# Patient Record
Sex: Female | Born: 1960
Health system: Southern US, Community
[De-identification: ages and names within clinical notes are randomized; demographics above are authoritative.]

## PROBLEM LIST (undated history)

## (undated) DIAGNOSIS — I471 Supraventricular tachycardia, unspecified: Secondary | ICD-10-CM

## (undated) DIAGNOSIS — R7303 Prediabetes: Secondary | ICD-10-CM

## (undated) DIAGNOSIS — E119 Type 2 diabetes mellitus without complications: Secondary | ICD-10-CM

## (undated) DIAGNOSIS — N83292 Other ovarian cyst, left side: Secondary | ICD-10-CM

## (undated) DIAGNOSIS — C439 Malignant melanoma of skin, unspecified: Secondary | ICD-10-CM

## (undated) HISTORY — DX: Supraventricular tachycardia: I47.1

## (undated) HISTORY — DX: Prediabetes: R73.03

## (undated) HISTORY — PX: CHOLECYSTECTOMY: SHX55

## (undated) HISTORY — DX: Type 2 diabetes mellitus without complications: E11.9

## (undated) HISTORY — DX: Supraventricular tachycardia, unspecified: I47.10

## (undated) HISTORY — DX: Other ovarian cyst, left side: N83.292

## (undated) HISTORY — DX: Malignant melanoma of skin, unspecified: C43.9

---

## 2001-01-07 ENCOUNTER — Ambulatory Visit (HOSPITAL_COMMUNITY): Admission: RE | Admit: 2001-01-07 | Discharge: 2001-01-07 | Payer: Self-pay | Admitting: Specialist

## 2001-01-07 ENCOUNTER — Encounter: Payer: Self-pay | Admitting: Specialist

## 2001-01-17 ENCOUNTER — Ambulatory Visit (HOSPITAL_COMMUNITY): Admission: RE | Admit: 2001-01-17 | Discharge: 2001-01-17 | Payer: Self-pay | Admitting: Specialist

## 2001-01-17 ENCOUNTER — Encounter: Payer: Self-pay | Admitting: Specialist

## 2001-09-09 ENCOUNTER — Other Ambulatory Visit: Admission: RE | Admit: 2001-09-09 | Discharge: 2001-09-09 | Payer: Self-pay | Admitting: Obstetrics and Gynecology

## 2002-01-16 ENCOUNTER — Ambulatory Visit (HOSPITAL_COMMUNITY): Admission: RE | Admit: 2002-01-16 | Discharge: 2002-01-16 | Payer: Self-pay | Admitting: *Deleted

## 2002-01-16 ENCOUNTER — Encounter: Payer: Self-pay | Admitting: Specialist

## 2002-02-17 ENCOUNTER — Encounter: Payer: Self-pay | Admitting: Family Medicine

## 2002-02-17 ENCOUNTER — Ambulatory Visit (HOSPITAL_COMMUNITY): Admission: RE | Admit: 2002-02-17 | Discharge: 2002-02-17 | Payer: Self-pay | Admitting: Family Medicine

## 2003-02-13 ENCOUNTER — Ambulatory Visit (HOSPITAL_COMMUNITY): Admission: RE | Admit: 2003-02-13 | Discharge: 2003-02-13 | Payer: Self-pay | Admitting: Specialist

## 2003-02-13 ENCOUNTER — Encounter: Payer: Self-pay | Admitting: Specialist

## 2004-03-11 ENCOUNTER — Ambulatory Visit (HOSPITAL_COMMUNITY): Admission: RE | Admit: 2004-03-11 | Discharge: 2004-03-11 | Payer: Self-pay | Admitting: Specialist

## 2004-05-12 ENCOUNTER — Ambulatory Visit (HOSPITAL_COMMUNITY): Admission: RE | Admit: 2004-05-12 | Discharge: 2004-05-12 | Payer: Self-pay | Admitting: Family Medicine

## 2005-04-10 ENCOUNTER — Emergency Department (HOSPITAL_COMMUNITY): Admission: EM | Admit: 2005-04-10 | Discharge: 2005-04-10 | Payer: Self-pay | Admitting: Emergency Medicine

## 2005-05-05 ENCOUNTER — Ambulatory Visit (HOSPITAL_COMMUNITY): Admission: RE | Admit: 2005-05-05 | Discharge: 2005-05-05 | Payer: Self-pay | Admitting: Specialist

## 2006-05-14 ENCOUNTER — Ambulatory Visit (HOSPITAL_COMMUNITY): Admission: RE | Admit: 2006-05-14 | Discharge: 2006-05-14 | Payer: Self-pay | Admitting: Internal Medicine

## 2007-07-08 ENCOUNTER — Ambulatory Visit (HOSPITAL_COMMUNITY): Admission: RE | Admit: 2007-07-08 | Discharge: 2007-07-08 | Payer: Self-pay | Admitting: Internal Medicine

## 2008-06-13 ENCOUNTER — Other Ambulatory Visit: Admission: RE | Admit: 2008-06-13 | Discharge: 2008-06-13 | Payer: Self-pay | Admitting: Obstetrics and Gynecology

## 2008-07-03 ENCOUNTER — Ambulatory Visit (HOSPITAL_COMMUNITY): Admission: RE | Admit: 2008-07-03 | Discharge: 2008-07-03 | Payer: Self-pay | Admitting: Obstetrics and Gynecology

## 2008-08-24 ENCOUNTER — Ambulatory Visit (HOSPITAL_COMMUNITY): Admission: RE | Admit: 2008-08-24 | Discharge: 2008-08-24 | Payer: Self-pay | Admitting: Internal Medicine

## 2009-08-15 ENCOUNTER — Other Ambulatory Visit: Admission: RE | Admit: 2009-08-15 | Discharge: 2009-08-15 | Payer: Self-pay | Admitting: Obstetrics and Gynecology

## 2009-08-29 ENCOUNTER — Ambulatory Visit (HOSPITAL_COMMUNITY): Admission: RE | Admit: 2009-08-29 | Discharge: 2009-08-29 | Payer: Self-pay | Admitting: Internal Medicine

## 2009-09-02 ENCOUNTER — Ambulatory Visit (HOSPITAL_COMMUNITY): Admission: RE | Admit: 2009-09-02 | Discharge: 2009-09-02 | Payer: Self-pay | Admitting: Obstetrics & Gynecology

## 2009-11-28 ENCOUNTER — Ambulatory Visit (HOSPITAL_COMMUNITY): Admission: RE | Admit: 2009-11-28 | Discharge: 2009-11-28 | Payer: Self-pay | Admitting: Obstetrics & Gynecology

## 2010-07-06 ENCOUNTER — Encounter: Payer: Self-pay | Admitting: Obstetrics and Gynecology

## 2010-07-06 ENCOUNTER — Encounter: Payer: Self-pay | Admitting: Specialist

## 2010-07-06 ENCOUNTER — Encounter: Payer: Self-pay | Admitting: Internal Medicine

## 2010-09-09 ENCOUNTER — Other Ambulatory Visit: Payer: Self-pay | Admitting: Obstetrics & Gynecology

## 2010-09-09 DIAGNOSIS — Z139 Encounter for screening, unspecified: Secondary | ICD-10-CM

## 2010-09-11 ENCOUNTER — Ambulatory Visit (HOSPITAL_COMMUNITY)
Admission: RE | Admit: 2010-09-11 | Discharge: 2010-09-11 | Disposition: A | Discharge status: Home / Self Care | Payer: 59 | Source: Ambulatory Visit | Attending: Obstetrics & Gynecology | Admitting: Obstetrics & Gynecology

## 2010-09-11 DIAGNOSIS — Z139 Encounter for screening, unspecified: Secondary | ICD-10-CM

## 2010-09-11 DIAGNOSIS — Z1231 Encounter for screening mammogram for malignant neoplasm of breast: Secondary | ICD-10-CM | POA: Insufficient documentation

## 2010-11-11 ENCOUNTER — Other Ambulatory Visit (HOSPITAL_COMMUNITY)
Admission: RE | Admit: 2010-11-11 | Discharge: 2010-11-11 | Disposition: A | Discharge status: Home / Self Care | Payer: 59 | Source: Ambulatory Visit | Attending: Obstetrics & Gynecology | Admitting: Obstetrics & Gynecology

## 2010-11-11 ENCOUNTER — Other Ambulatory Visit: Payer: Self-pay | Admitting: Obstetrics & Gynecology

## 2010-11-11 DIAGNOSIS — Z01419 Encounter for gynecological examination (general) (routine) without abnormal findings: Secondary | ICD-10-CM | POA: Insufficient documentation

## 2011-09-22 ENCOUNTER — Other Ambulatory Visit: Payer: Self-pay | Admitting: Obstetrics & Gynecology

## 2011-09-22 DIAGNOSIS — Z139 Encounter for screening, unspecified: Secondary | ICD-10-CM

## 2011-09-29 ENCOUNTER — Ambulatory Visit (HOSPITAL_COMMUNITY)
Admission: RE | Admit: 2011-09-29 | Discharge: 2011-09-29 | Disposition: A | Discharge status: Home / Self Care | Payer: 59 | Source: Ambulatory Visit | Attending: Obstetrics & Gynecology | Admitting: Obstetrics & Gynecology

## 2011-09-29 DIAGNOSIS — Z139 Encounter for screening, unspecified: Secondary | ICD-10-CM

## 2011-09-29 DIAGNOSIS — Z1231 Encounter for screening mammogram for malignant neoplasm of breast: Secondary | ICD-10-CM | POA: Insufficient documentation

## 2012-01-19 ENCOUNTER — Other Ambulatory Visit: Payer: Self-pay | Admitting: Obstetrics & Gynecology

## 2012-01-19 ENCOUNTER — Other Ambulatory Visit (HOSPITAL_COMMUNITY)
Admission: RE | Admit: 2012-01-19 | Discharge: 2012-01-19 | Disposition: A | Discharge status: Home / Self Care | Payer: 59 | Source: Ambulatory Visit | Attending: Obstetrics & Gynecology | Admitting: Obstetrics & Gynecology

## 2012-01-19 DIAGNOSIS — Z01419 Encounter for gynecological examination (general) (routine) without abnormal findings: Secondary | ICD-10-CM | POA: Insufficient documentation

## 2012-02-04 ENCOUNTER — Encounter (HOSPITAL_COMMUNITY): Payer: Self-pay | Admitting: Dietician

## 2012-02-04 NOTE — Progress Notes (Signed)
[facility] Diabetes Class Completion  Date:February 04, 2012  Time: 6:30 PM  Pt attended [facility]'s Diabetes Class on February 04, 2012.   Patient was educated on the following topics: carbohydrate metabolism in relation to diabetes, sources of carbohydrate, carbohydrate counting, meal planning strategies, food label reading, and portion control.   Melody Haver, RD, LDN Date:February 04, 2012 Time: 6:30 PM

## 2012-09-23 ENCOUNTER — Other Ambulatory Visit: Payer: Self-pay | Admitting: Obstetrics & Gynecology

## 2012-09-23 DIAGNOSIS — Z139 Encounter for screening, unspecified: Secondary | ICD-10-CM

## 2012-10-03 ENCOUNTER — Ambulatory Visit (HOSPITAL_COMMUNITY)
Admission: RE | Admit: 2012-10-03 | Discharge: 2012-10-03 | Disposition: A | Discharge status: Home / Self Care | Payer: No Typology Code available for payment source | Source: Ambulatory Visit | Attending: Obstetrics & Gynecology | Admitting: Obstetrics & Gynecology

## 2012-10-03 DIAGNOSIS — Z1231 Encounter for screening mammogram for malignant neoplasm of breast: Secondary | ICD-10-CM | POA: Insufficient documentation

## 2012-10-03 DIAGNOSIS — Z139 Encounter for screening, unspecified: Secondary | ICD-10-CM

## 2012-11-14 ENCOUNTER — Ambulatory Visit: Payer: Self-pay | Admitting: Obstetrics & Gynecology

## 2013-05-02 ENCOUNTER — Other Ambulatory Visit (HOSPITAL_COMMUNITY)
Admission: RE | Admit: 2013-05-02 | Discharge: 2013-05-02 | Disposition: A | Discharge status: Home / Self Care | Payer: No Typology Code available for payment source | Source: Ambulatory Visit | Attending: Obstetrics & Gynecology | Admitting: Obstetrics & Gynecology

## 2013-05-02 ENCOUNTER — Ambulatory Visit (INDEPENDENT_AMBULATORY_CARE_PROVIDER_SITE_OTHER): Payer: No Typology Code available for payment source | Admitting: Obstetrics & Gynecology

## 2013-05-02 ENCOUNTER — Encounter: Payer: Self-pay | Admitting: Obstetrics & Gynecology

## 2013-05-02 ENCOUNTER — Encounter (INDEPENDENT_AMBULATORY_CARE_PROVIDER_SITE_OTHER): Payer: Self-pay

## 2013-05-02 VITALS — BP 130/90 | Ht 70.0 in | Wt 214.0 lb

## 2013-05-02 DIAGNOSIS — Z1212 Encounter for screening for malignant neoplasm of rectum: Secondary | ICD-10-CM

## 2013-05-02 DIAGNOSIS — E119 Type 2 diabetes mellitus without complications: Secondary | ICD-10-CM | POA: Insufficient documentation

## 2013-05-02 DIAGNOSIS — Z01419 Encounter for gynecological examination (general) (routine) without abnormal findings: Secondary | ICD-10-CM | POA: Insufficient documentation

## 2013-05-02 DIAGNOSIS — N361 Urethral diverticulum: Secondary | ICD-10-CM | POA: Insufficient documentation

## 2013-05-02 DIAGNOSIS — Z1151 Encounter for screening for human papillomavirus (HPV): Secondary | ICD-10-CM | POA: Insufficient documentation

## 2013-05-02 MED ORDER — FLUTICASONE PROPIONATE 0.05 % EX CREA: TOPICAL_CREAM | Freq: Two times a day (BID) | CUTANEOUS | Status: DC

## 2013-05-02 NOTE — Addendum Note (Signed)
Addended by: Richardson Chiquito on: 05/02/2013 03:42 PM   Modules accepted: Orders

## 2013-05-02 NOTE — Progress Notes (Signed)
Patient ID: Julia Wang, female   DOB: 07/20/1960, 52 y.o.   MRN: [id number] Subjective:     Julia Wang is a 52 y.o. female here for a routine exam.  Patient's last menstrual period was 04/15/2013. No obstetric history on file. Current complaints: none.    Gynecologic History Patient's last menstrual period was 04/15/2013. Contraception: none Last Pap: 2013. Results were: normal Last mammogram: 2014. Results were: normal  Past Medical History  Diagnosis Date  . Diabetes mellitus without complication     History reviewed. No pertinent past surgical history.  OB History   Grav Para Term Preterm Abortions TAB SAB Ect Mult Living                  History   Social History  . Marital Status: Married    Spouse Name: N/A    Number of Children: N/A  . Years of Education: N/A   Social History Main Topics  . Smoking status: Never Smoker   . Smokeless tobacco: None  . Alcohol Use: None  . Drug Use: None  . Sexual Activity: None   Other Topics Concern  . None   Social History Narrative  . None    Family History  Problem Relation Age of Onset  . Cancer Mother      Review of Systems  Review of Systems  Constitutional: Negative for fever, chills, weight loss, malaise/fatigue and diaphoresis.  HENT: Negative for hearing loss, ear pain, nosebleeds, congestion, sore throat, neck pain, tinnitus and ear discharge.   Eyes: Negative for blurred vision, double vision, photophobia, pain, discharge and redness.  Respiratory: Negative for cough, hemoptysis, sputum production, shortness of breath, wheezing and stridor.   Cardiovascular: Negative for chest pain, palpitations, orthopnea, claudication, leg swelling and PND.  Gastrointestinal: negative for abdominal pain. Negative for heartburn, nausea, vomiting, diarrhea, constipation, blood in stool and melena.  Genitourinary: Negative for dysuria, urgency, frequency, hematuria and flank pain.  Musculoskeletal: Negative for  myalgias, back pain, joint pain and falls.  Skin: Negative for itching and rash.  Neurological: Negative for dizziness, tingling, tremors, sensory change, speech change, focal weakness, seizures, loss of consciousness, weakness and headaches.  Endo/Heme/Allergies: Negative for environmental allergies and polydipsia. Does not bruise/bleed easily.  Psychiatric/Behavioral: Negative for depression, suicidal ideas, hallucinations, memory loss and substance abuse. The patient is not nervous/anxious and does not have insomnia.        Objective:    Physical Exam  Vitals reviewed. Constitutional: She is oriented to person, place, and time. She appears well-developed and well-nourished.  HENT:  Head: Normocephalic and atraumatic.        Right Ear: External ear normal.  Left Ear: External ear normal.  Nose: Nose normal.  Mouth/Throat: Oropharynx is clear and moist.  Eyes: Conjunctivae and EOM are normal. Pupils are equal, round, and reactive to light. Right eye exhibits no discharge. Left eye exhibits no discharge. No scleral icterus.  Neck: Normal range of motion. Neck supple. No tracheal deviation present. No thyromegaly present.  Cardiovascular: Normal rate, regular rhythm, normal heart sounds and intact distal pulses.  Exam reveals no gallop and no friction rub.   No murmur heard. Respiratory: Effort normal and breath sounds normal. No respiratory distress. She has no wheezes. She has no rales. She exhibits no tenderness.  GI: Soft. Bowel sounds are normal. She exhibits no distension and no mass. There is no tenderness. There is no rebound and no guarding.  Genitourinary:  Breasts no masses skin changes  or nipple changes bilaterally      Vulva is normal without lesions Vagina is pink moist without discharge, midline urethral diverticulum stable, no complaint of urinary tract infections Cervix normal in appearance and pap is done Uterus is normal size shape and contour Adnexa is negative with  normal sized ovaries  Rectal    hemoccult negative, normal tone, no masses  Musculoskeletal: Normal range of motion. She exhibits no edema and no tenderness.  Neurological: She is alert and oriented to person, place, and time. She has normal reflexes. She displays normal reflexes. No cranial nerve deficit. She exhibits normal muscle tone. Coordination normal.  Skin: Skin is warm and dry. No rash noted. No erythema. No pallor.  Psychiatric: She has a normal mood and affect. Her behavior is normal. Judgment and thought content normal.       Assessment:    Healthy female exam.    Plan:    Follow up in: 1 year.

## 2013-09-14 ENCOUNTER — Other Ambulatory Visit (HOSPITAL_COMMUNITY): Payer: Self-pay | Admitting: Internal Medicine

## 2013-09-14 DIAGNOSIS — Z1231 Encounter for screening mammogram for malignant neoplasm of breast: Secondary | ICD-10-CM

## 2013-10-05 ENCOUNTER — Ambulatory Visit (HOSPITAL_COMMUNITY)
Admission: RE | Admit: 2013-10-05 | Discharge: 2013-10-05 | Disposition: A | Discharge status: Home / Self Care | Payer: No Typology Code available for payment source | Source: Ambulatory Visit | Attending: Internal Medicine | Admitting: Internal Medicine

## 2013-10-05 DIAGNOSIS — Z1231 Encounter for screening mammogram for malignant neoplasm of breast: Secondary | ICD-10-CM | POA: Insufficient documentation

## 2014-05-07 ENCOUNTER — Other Ambulatory Visit: Payer: No Typology Code available for payment source | Admitting: Obstetrics & Gynecology

## 2014-05-07 ENCOUNTER — Encounter: Payer: Self-pay | Admitting: Obstetrics & Gynecology

## 2014-05-07 ENCOUNTER — Other Ambulatory Visit (HOSPITAL_COMMUNITY)
Admission: RE | Admit: 2014-05-07 | Discharge: 2014-05-07 | Disposition: A | Discharge status: Home / Self Care | Payer: No Typology Code available for payment source | Source: Ambulatory Visit | Attending: Obstetrics & Gynecology | Admitting: Obstetrics & Gynecology

## 2014-05-07 ENCOUNTER — Ambulatory Visit (INDEPENDENT_AMBULATORY_CARE_PROVIDER_SITE_OTHER): Payer: No Typology Code available for payment source | Admitting: Obstetrics & Gynecology

## 2014-05-07 VITALS — BP 130/80 | Ht 69.2 in | Wt 219.0 lb

## 2014-05-07 DIAGNOSIS — Z01419 Encounter for gynecological examination (general) (routine) without abnormal findings: Secondary | ICD-10-CM | POA: Insufficient documentation

## 2014-05-07 DIAGNOSIS — Z1212 Encounter for screening for malignant neoplasm of rectum: Secondary | ICD-10-CM

## 2014-05-07 DIAGNOSIS — Z1211 Encounter for screening for malignant neoplasm of colon: Secondary | ICD-10-CM

## 2014-05-07 MED ORDER — SILVER SULFADIAZINE 1 % EX CREA: TOPICAL_CREAM | CUTANEOUS | Status: DC

## 2014-05-07 NOTE — Progress Notes (Signed)
Patient ID: Julia Wang, female   DOB: 1961-04-15, 53 y.o.   MRN: [id number] Subjective:     Julia Wang is a 53 y.o. female here for a routine exam.  Patient's last menstrual period was 04/14/2014. No obstetric history on file. Birth Control Method:  none Menstrual Calendar(currently): regular  Current complaints: none.   Current acute medical issues:  Type 2 DM   Recent Gynecologic History Patient's last menstrual period was 04/14/2014. Last Pap: 2014,  normal Last mammogram: 2015,  normal  Past Medical History  Diagnosis Date  . Diabetes mellitus without complication     History reviewed. No pertinent past surgical history.  OB History    No data available      History   Social History  . Marital Status: Married    Spouse Name: N/A    Number of Children: N/A  . Years of Education: N/A   Social History Main Topics  . Smoking status: Never Smoker   . Smokeless tobacco: None  . Alcohol Use: None  . Drug Use: None  . Sexual Activity: None   Other Topics Concern  . None   Social History Narrative    Family History  Problem Relation Age of Onset  . Cancer Mother     Current outpatient prescriptions: metFORMIN (GLUCOPHAGE) 500 MG tablet, Take by mouth 2 (two) times daily with a meal., Disp: , Rfl: ;  sertraline (ZOLOFT) 50 MG tablet, Take 50 mg by mouth daily., Disp: , Rfl: ;  fluticasone (CUTIVATE) 0.05 % cream, Apply topically 2 (two) times daily. (Patient not taking: Reported on 05/07/2014), Disp: 30 g, Rfl: 1;  silver sulfADIAZINE (SILVADENE) 1 % cream, Apply to area 2 to 3 times daily, Disp: 50 g, Rfl: 11  Review of Systems  Review of Systems  Constitutional: Negative for fever, chills, weight loss, malaise/fatigue and diaphoresis.  HENT: Negative for hearing loss, ear pain, nosebleeds, congestion, sore throat, neck pain, tinnitus and ear discharge.   Eyes: Negative for blurred vision, double vision, photophobia, pain, discharge and redness.   Respiratory: Negative for cough, hemoptysis, sputum production, shortness of breath, wheezing and stridor.   Cardiovascular: Negative for chest pain, palpitations, orthopnea, claudication, leg swelling and PND.  Gastrointestinal: negative for abdominal pain. Negative for heartburn, nausea, vomiting, diarrhea, constipation, blood in stool and melena.  Genitourinary: Negative for dysuria, urgency, frequency, hematuria and flank pain.  Musculoskeletal: Negative for myalgias, back pain, joint pain and falls.  Skin: Negative for itching and rash.  Neurological: Negative for dizziness, tingling, tremors, sensory change, speech change, focal weakness, seizures, loss of consciousness, weakness and headaches.  Endo/Heme/Allergies: Negative for environmental allergies and polydipsia. Does not bruise/bleed easily.  Psychiatric/Behavioral: Negative for depression, suicidal ideas, hallucinations, memory loss and substance abuse. The patient is not nervous/anxious and does not have insomnia.        Objective:  Blood pressure 130/80, height 5' 9.2" (1.758 m), weight 219 lb (99.338 kg), last menstrual period 04/14/2014.   Physical Exam  Vitals reviewed. Constitutional: She is oriented to person, place, and time. She appears well-developed and well-nourished.  HENT:  Head: Normocephalic and atraumatic.        Right Ear: External ear normal.  Left Ear: External ear normal.  Nose: Nose normal.  Mouth/Throat: Oropharynx is clear and moist.  Eyes: Conjunctivae and EOM are normal. Pupils are equal, round, and reactive to light. Right eye exhibits no discharge. Left eye exhibits no discharge. No scleral icterus.  Neck: Normal range of  motion. Neck supple. No tracheal deviation present. No thyromegaly present.  Cardiovascular: Normal rate, regular rhythm, normal heart sounds and intact distal pulses.  Exam reveals no gallop and no friction rub.   No murmur heard. Respiratory: Effort normal and breath sounds  normal. No respiratory distress. She has no wheezes. She has no rales. She exhibits no tenderness.  GI: Soft. Bowel sounds are normal. She exhibits no distension and no mass. There is no tenderness. There is no rebound and no guarding.  Genitourinary:  Breasts no masses skin changes or nipple changes bilaterally      Vulva is normal without lesions Vagina is pink moist without discharge Cervix normal in appearance and pap is done Uterus is normal size shape and contour Adnexa is negative with normal sized ovaries  Rectal    hemoccult negative, normal tone, no masses  Musculoskeletal: Normal range of motion. She exhibits no edema and no tenderness.  Neurological: She is alert and oriented to person, place, and time. She has normal reflexes. She displays normal reflexes. No cranial nerve deficit. She exhibits normal muscle tone. Coordination normal.  Skin: Skin is warm and dry. No rash noted. No erythema. No pallor.  Psychiatric: She has a normal mood and affect. Her behavior is normal. Judgment and thought content normal.       Assessment:    Healthy female exam.    Plan:    Mammogram ordered. Follow up in: 1 year.

## 2014-05-08 LAB — CYTOLOGY - PAP

## 2014-10-01 ENCOUNTER — Other Ambulatory Visit: Payer: Self-pay | Admitting: Obstetrics & Gynecology

## 2014-10-01 DIAGNOSIS — Z1231 Encounter for screening mammogram for malignant neoplasm of breast: Secondary | ICD-10-CM

## 2014-10-25 ENCOUNTER — Ambulatory Visit (HOSPITAL_COMMUNITY)
Admission: RE | Admit: 2014-10-25 | Discharge: 2014-10-25 | Disposition: A | Discharge status: Home / Self Care | Payer: 59 | Source: Ambulatory Visit | Attending: Obstetrics & Gynecology | Admitting: Obstetrics & Gynecology

## 2014-10-25 DIAGNOSIS — Z1231 Encounter for screening mammogram for malignant neoplasm of breast: Secondary | ICD-10-CM | POA: Insufficient documentation

## 2014-11-15 ENCOUNTER — Ambulatory Visit (INDEPENDENT_AMBULATORY_CARE_PROVIDER_SITE_OTHER): Payer: 59 | Admitting: Obstetrics & Gynecology

## 2014-11-15 ENCOUNTER — Encounter: Payer: Self-pay | Admitting: Obstetrics & Gynecology

## 2014-11-15 VITALS — BP 118/70 | HR 72 | Wt 223.0 lb

## 2014-11-15 DIAGNOSIS — N921 Excessive and frequent menstruation with irregular cycle: Secondary | ICD-10-CM | POA: Diagnosis not present

## 2014-11-15 DIAGNOSIS — D500 Iron deficiency anemia secondary to blood loss (chronic): Secondary | ICD-10-CM | POA: Diagnosis not present

## 2014-11-15 LAB — POCT HEMOGLOBIN: Hemoglobin: 10.1 g/dL — AB (ref 12.2–16.2)

## 2014-11-15 MED ORDER — MEGESTROL ACETATE 40 MG PO TABS: ORAL_TABLET | ORAL | Status: DC

## 2014-11-15 NOTE — Progress Notes (Signed)
Patient ID: Julia Wang, female   DOB: 11/01/1960, 54 y.o.   MRN: [id number]    Chief Complaint  Patient presents with  . gyn visit    period x 1 month.c/c spotting now.feel tired.     HPI:    54 y.o. No obstetric history on file. Patient's last menstrual period was 10/25/2014.  Bled for the last month, has been skipping some periods the last 6 months Location:  Vaginal bleeding. Quality:  Variable some clots. Severity:  variable. Timing:  daily. Duration:  1 month. Context:  . Modifying factors:   Signs/Symptoms:      Current outpatient prescriptions:  .  fluticasone (CUTIVATE) 0.05 % cream, Apply topically 2 (two) times daily., Disp: 30 g, Rfl: 1 .  metFORMIN (GLUCOPHAGE) 500 MG tablet, Take by mouth 2 (two) times daily with a meal., Disp: , Rfl:  .  sertraline (ZOLOFT) 50 MG tablet, Take 50 mg by mouth daily., Disp: , Rfl:  .  silver sulfADIAZINE (SILVADENE) 1 % cream, Apply to area 2 to 3 times daily (Patient not taking: Reported on 11/15/2014), Disp: 50 g, Rfl: 11  Problem Pertinent ROS:        No burning with urination, frequency or urgency No nausea, vomiting or diarrhea Nor fever chills or other constitutional symptoms   Extended ROS:        [place]:             Past Medical History  Diagnosis Date  . Diabetes mellitus without complication     History reviewed. No pertinent past surgical history.  OB History    No data available      Allergies  Allergen Reactions  . Daypro [Oxaprozin]     hives    History   Social History  . Marital Status: Married    Spouse Name: N/A  . Number of Children: N/A  . Years of Education: N/A   Social History Main Topics  . Smoking status: Never Smoker   . Smokeless tobacco: Not on file  . Alcohol Use: Not on file  . Drug Use: Not on file  . Sexual Activity: Not on file   Other Topics Concern  . None   Social History Narrative    Family History  Problem Relation Age of Onset  . Cancer Mother       Examination:  Vitals:  Blood pressure 118/70, pulse 72, weight 223 lb (101.152 kg), last menstrual period 10/25/2014.    Physical Examination:         DATA orders and reviews: Labs were ordered today:  hemoglobin Imaging studies were ordered today:  sonogram  Lab tests were not reviewed today:    Imaging studies were not reviewed today:    I did not independently review/view images, tracing or specimen(not simply the report) myself.  Prescription Drug Management:  New Prescriptions: megestrol algorithm Renewed Prescriptions:   Current prescription changes:     Impression/Plan(Problem Based): 1.  menometrorrhagia      (new problem) : Additional workup is needed:  sonogram  {2.  anemia      (new problem:) : Additional workup is needed:  sonogram     Follow Up:   1  months     Face to face time:  15 minutes  Greater than 50% of the visit time was spent in counseling and coordination of care with the patient.  The summary and outline of the counseling and care coordination is summarized in the note above.  All questions were answered.

## 2014-12-13 ENCOUNTER — Ambulatory Visit (INDEPENDENT_AMBULATORY_CARE_PROVIDER_SITE_OTHER): Payer: 59 | Admitting: Obstetrics & Gynecology

## 2014-12-13 ENCOUNTER — Encounter: Payer: Self-pay | Admitting: Obstetrics & Gynecology

## 2014-12-13 ENCOUNTER — Ambulatory Visit (INDEPENDENT_AMBULATORY_CARE_PROVIDER_SITE_OTHER): Payer: 59

## 2014-12-13 VITALS — BP 128/80 | HR 84 | Wt 216.0 lb

## 2014-12-13 DIAGNOSIS — N921 Excessive and frequent menstruation with irregular cycle: Secondary | ICD-10-CM | POA: Diagnosis not present

## 2014-12-13 DIAGNOSIS — D500 Iron deficiency anemia secondary to blood loss (chronic): Secondary | ICD-10-CM

## 2014-12-13 LAB — POCT HEMOGLOBIN: HEMOGLOBIN: 12.4 g/dL (ref 12.2–16.2)

## 2014-12-13 MED ORDER — MEGESTROL ACETATE 40 MG PO TABS: ORAL_TABLET | ORAL | Status: DC

## 2014-12-13 NOTE — Progress Notes (Signed)
Patient ID: Julia Wang, female   DOB: June 23, 1960, 54 y.o.   MRN: [id number] Patient ID: Julia Wang, female   DOB: Aug 18, 1960, 54 y.o.   MRN: [id number]  US Transvaginal Non-ob  12/13/2014   GYNECOLOGIC SONOGRAM   Julia Wang is a 54 y.o.. LMP 12/05/2014 for a pelvic sonogram for  menometrorrhagia,anemia.Marland Kitchen  Uterus                      9.23 x 8.21 x 7.2 cm,  anteverted uterus w/  mult fibroids (#1) ant fundal 2.89 x 2.3 x 1.9 cm (#2) post bdy 3.7 x 1.9  x 3cm (#3) fundal 3.7 x 2.6 x 2.2cm,  Endometrium          6.1 mm, symmetrical, wnl  Right ovary             2.2 x 2.31 x 1.3 cm, wnl  Left ovary                4.57 x 2.7x 3.6 cm, lt ov complex exophytic cyst  (?endometrioma)5.9 x 5.3 x 5.4cm and a 2.3 x 2.2 x 2cm simple cyst,    Technician Comments:  US PELVIS TA /TV anteverted uterus w/ mult fibroids (#1) ant fundal 2.89 x  2.3 x 1.9 cm (#2) post bdy 3.7 x 1.9 x 3cm (#3) fundal 3.7 x 2.6 x  2.2cm,normal rt ov,lt ov complex exophytic cyst (?endometrioma)5.9 x 5.3 x  5.4cm and a 2.3 x 2.2 x 2cm simple cyst,mult simple nabothian cysts,pain  on lt during ultrasound,ov's appear to be mobile.    Amber Heide Guile 12/13/2014 9:59 AM  Clinical Impression and recommendations:  I have reviewed the sonogram results above, combined with the patient's  current clinical course, below are my impressions and any appropriate  recommendations for management based on the sonographic findings.  Small fibroids of uterus Normal endometrium, non distorted Normal right ovary Left ovarian endometrioma   Aruna Nestler H 12/13/2014 11:01 AM    US Pelvis Complete  12/13/2014   GYNECOLOGIC SONOGRAM   Julia Wang is a 54 y.o.. LMP 12/05/2014 for a pelvic sonogram for  menometrorrhagia,anemia.Marland Kitchen  Uterus                      9.23 x 8.21 x 7.2 cm,  anteverted uterus w/  mult fibroids (#1) ant fundal 2.89 x 2.3 x 1.9 cm (#2) post bdy 3.7 x 1.9  x 3cm (#3) fundal 3.7 x 2.6 x 2.2cm,  Endometrium          6.1 mm, symmetrical, wnl  Right ovary              2.2 x 2.31 x 1.3 cm, wnl  Left ovary                4.57 x 2.7x 3.6 cm, lt ov complex exophytic cyst  (?endometrioma)5.9 x 5.3 x 5.4cm and a 2.3 x 2.2 x 2cm simple cyst,    Technician Comments:  US PELVIS TA /TV anteverted uterus w/ mult fibroids (#1) ant fundal 2.89 x  2.3 x 1.9 cm (#2) post bdy 3.7 x 1.9 x 3cm (#3) fundal 3.7 x 2.6 x  2.2cm,normal rt ov,lt ov complex exophytic cyst (?endometrioma)5.9 x 5.3 x  5.4cm and a 2.3 x 2.2 x 2cm simple cyst,mult simple nabothian cysts,pain  on lt during ultrasound,ov's appear to be mobile.    Julia Wang 12/13/2014 9:59 AM  Clinical  Impression and recommendations:  I have reviewed the sonogram results above, combined with the patient's  current clinical course, below are my impressions and any appropriate  recommendations for management based on the sonographic findings.  Small fibroids of uterus Normal endometrium, non distorted Normal right ovary Left ovarian endometrioma   Jernard Reiber H 12/13/2014 11:01 AM    Sonogram above see report  Responded well to the megestrol bled when mistakenly stopped  Discussed options with patient:  Will continue megestrol continuously with a 1 week holiday every 3 months as we try to avoid any surgical procedures this close to menopause  Follow up in 3 months     Face to face time:  15 minutes  Greater than 50% of the visit time was spent in counseling and coordination of care with the patient.  The summary and outline of the counseling and care coordination is summarized in the note above.   All questions were answered.    Chief Complaint  Patient presents with  . Follow-up    ultrasound     HPI:    54 y.o. No obstetric history on file. Patient's last menstrual period was 12/05/2014 (exact date).  Bled for the last month, has been skipping some periods the last 6 months Location:  Vaginal bleeding. Quality:  Variable some clots. Severity:  variable. Timing:  daily. Duration:  1 month. Context:   . Modifying factors:   Signs/Symptoms:      Current outpatient prescriptions:  .  ALPRAZolam (XANAX) 0.5 MG tablet, Take 0.5 mg by mouth at bedtime as needed for anxiety., Disp: , Rfl:  .  megestrol (MEGACE) 40 MG tablet, 3 tablets a day for 5 days, 2 tablets a day for 5 days then 1 tablet daily, Disp: 45 tablet, Rfl: 11 .  metFORMIN (GLUCOPHAGE) 500 MG tablet, Take by mouth 2 (two) times daily with a meal., Disp: , Rfl:  .  sertraline (ZOLOFT) 50 MG tablet, Take 50 mg by mouth daily., Disp: , Rfl:  .  fluticasone (CUTIVATE) 0.05 % cream, Apply topically 2 (two) times daily. (Patient not taking: Reported on 12/13/2014), Disp: 30 g, Rfl: 1 .  Julia sulfADIAZINE (SILVADENE) 1 % cream, Apply to area 2 to 3 times daily (Patient not taking: Reported on 11/15/2014), Disp: 50 g, Rfl: 11  Problem Pertinent ROS:        No burning with urination, frequency or urgency No nausea, vomiting or diarrhea Nor fever chills or other constitutional symptoms   Extended ROS:        [place]:             Past Medical History  Diagnosis Date  . Diabetes mellitus without complication     History reviewed. No pertinent past surgical history.  OB History    No data available      Allergies  Allergen Reactions  . Daypro [Oxaprozin]     hives    History   Social History  . Marital Status: Married    Spouse Name: N/A  . Number of Children: N/A  . Years of Education: N/A   Social History Main Topics  . Smoking status: Never Smoker   . Smokeless tobacco: Not on file  . Alcohol Use: Not on file  . Drug Use: Not on file  . Sexual Activity: Not on file   Other Topics Concern  . None   Social History Narrative    Family History  Problem Relation Age of Onset  . Cancer Mother  Examination:  Vitals:  Blood pressure 128/80, pulse 84, weight 216 lb (97.977 kg), last menstrual period 12/05/2014.    Physical Examination:         DATA orders and reviews: Labs were ordered  today:  hemoglobin Imaging studies were ordered today:  sonogram  Lab tests were not reviewed today:    Imaging studies were not reviewed today:    I did not independently review/view images, tracing or specimen(not simply the report) myself.  Prescription Drug Management:  New Prescriptions: megestrol algorithm Renewed Prescriptions:   Current prescription changes:     Impression/Plan(Problem Based): 1.  menometrorrhagia      (new problem) : Additional workup is needed:  sonogram  {2.  anemia      (new problem:) : Additional workup is needed:  sonogram     Follow Up:   1  months     Face to face time:  15 minutes  Greater than 50% of the visit time was spent in counseling and coordination of care with the patient.  The summary and outline of the counseling and care coordination is summarized in the note above.   All questions were answered.

## 2014-12-13 NOTE — Progress Notes (Signed)
US PELVIS TA /TV anteverted uterus w/ mult fibroids (#1) ant fundal 2.89 x 2.3 x 1.9 cm (#2) post bdy 3.7 x 1.9 x 3cm (#3) fundal 3.7 x 2.6 x 2.2cm,normal rt ov,lt ov complex exophytic cyst (?endometrioma)5.9 x 5.3 x 5.4cm and a 2.3 x 2.2 x 2cm simple cyst,mult simple nabothian cysts,pain on lt during ultrasound,ov's appear to be mobile.

## 2015-02-05 ENCOUNTER — Encounter (INDEPENDENT_AMBULATORY_CARE_PROVIDER_SITE_OTHER): Payer: Self-pay | Admitting: *Deleted

## 2015-02-28 ENCOUNTER — Encounter (INDEPENDENT_AMBULATORY_CARE_PROVIDER_SITE_OTHER): Payer: Self-pay | Admitting: *Deleted

## 2015-02-28 ENCOUNTER — Other Ambulatory Visit (INDEPENDENT_AMBULATORY_CARE_PROVIDER_SITE_OTHER): Payer: Self-pay | Admitting: *Deleted

## 2015-02-28 DIAGNOSIS — Z1211 Encounter for screening for malignant neoplasm of colon: Secondary | ICD-10-CM

## 2015-02-28 DIAGNOSIS — Z800 Family history of malignant neoplasm of digestive organs: Secondary | ICD-10-CM

## 2015-03-14 ENCOUNTER — Encounter: Payer: Self-pay | Admitting: Obstetrics & Gynecology

## 2015-03-14 ENCOUNTER — Ambulatory Visit (INDEPENDENT_AMBULATORY_CARE_PROVIDER_SITE_OTHER): Payer: 59 | Admitting: Obstetrics & Gynecology

## 2015-03-14 VITALS — BP 128/80 | HR 72 | Wt 217.0 lb

## 2015-03-14 DIAGNOSIS — N921 Excessive and frequent menstruation with irregular cycle: Secondary | ICD-10-CM | POA: Diagnosis not present

## 2015-03-14 NOTE — Progress Notes (Signed)
Patient ID: Julia Wang, female   DOB: Oct 21, 1960, 54 y.o.   MRN: [id number] Chief Complaint  Patient presents with  . Follow-up    taking Megestrol.    Blood pressure 128/80, pulse 72, weight 217 lb (98.431 kg).  54 y.o. No obstetric history on file. No LMP recorded. The current method of family planning is vasectomy.  Subjective Pt on megestrol for peri menopausal menometrorrhgia and dysmenorrhea  Objective   Pertinent ROS Bleeding is well controlled on megestrol  Labs or studies Labs from Dr Nevada Crane reviewed    Impression Diagnoses this Encounter::   ICD-9-CM ICD-10-CM   1. Menometrorrhagia 626.2 N92.1   considering ablation, discussed  Established relevant diagnosis(es): Diabetes, type 2  Plan/Recommendations: Meds ordered this encounter  Medications  . Iron-FA-B Cmp-C-Biot-Probiotic (FUSION PLUS PO)    Sig: Take by mouth.    Labs or Scans Ordered: No orders of the defined types were placed in this encounter.      Follow up Return in about 6 months (around 09/11/2015).      Face to face time:  10 minutes  Greater than 50% of the visit time was spent in counseling and coordination of care with the patient.  The summary and outline of the counseling and care coordination is summarized in the note above.   All questions were answered.

## 2015-03-26 ENCOUNTER — Telehealth (INDEPENDENT_AMBULATORY_CARE_PROVIDER_SITE_OTHER): Payer: Self-pay | Admitting: *Deleted

## 2015-03-26 DIAGNOSIS — Z1211 Encounter for screening for malignant neoplasm of colon: Secondary | ICD-10-CM

## 2015-03-26 MED ORDER — SUPREP BOWEL PREP KIT 17.5-3.13-1.6 GM/177ML PO SOLN: 1.0000 | Freq: Once | ORAL | Status: DC

## 2015-03-26 NOTE — Telephone Encounter (Signed)
Patient needs suprep 

## 2015-04-01 ENCOUNTER — Encounter (INDEPENDENT_AMBULATORY_CARE_PROVIDER_SITE_OTHER): Payer: Self-pay | Admitting: *Deleted

## 2015-04-09 ENCOUNTER — Telehealth (INDEPENDENT_AMBULATORY_CARE_PROVIDER_SITE_OTHER): Payer: Self-pay | Admitting: *Deleted

## 2015-04-09 NOTE — Telephone Encounter (Signed)
Referring MD/PCP: hall   Procedure: tcs  Reason/Indication:  Screening, fam hx colon ca  Has patient had this procedure before?  no  If so, when, by whom and where?    Is there a family history of colon cancer?  Yes, mother  Who?  What age when diagnosed?    Is patient diabetic?   yes      Does patient have prosthetic heart valve?  no  Do you have a pacemaker?  no  Has patient ever had endocarditis? no  Has patient had joint replacement within last 12 months?  no  Does patient tend to be constipated or take laxatives? no  Does patient have a history of alcohol/drug use? no  Is patient on Coumadin, Plavix and/or Aspirin? yes  Medications: asa 81 mg daily, megatrol 40 mg daily, sertraline 100 mg 1/2 tab daily, metformin 1000 mg bid, alprazolam 0.5 mg prn, cinnamon daily, multi vit daily, cranberry vitamin daily, vit d daily, iron  Allergies: daypro  Medication Adjustment: asa 2 days, iron 10 days, hold metformin evening before & morning of  Procedure date & time: 05/02/15 at 1030

## 2015-04-09 NOTE — Telephone Encounter (Signed)
agree

## 2015-05-02 ENCOUNTER — Encounter (HOSPITAL_COMMUNITY)
Admission: RE | Disposition: A | Discharge status: Home / Self Care | Payer: Self-pay | Source: Ambulatory Visit | Attending: Internal Medicine

## 2015-05-02 ENCOUNTER — Ambulatory Visit (HOSPITAL_COMMUNITY)
Admission: RE | Admit: 2015-05-02 | Discharge: 2015-05-02 | Disposition: A | Discharge status: Home / Self Care | Payer: 59 | Source: Ambulatory Visit | Attending: Internal Medicine | Admitting: Internal Medicine

## 2015-05-02 ENCOUNTER — Encounter (HOSPITAL_COMMUNITY): Payer: Self-pay | Admitting: *Deleted

## 2015-05-02 DIAGNOSIS — K644 Residual hemorrhoidal skin tags: Secondary | ICD-10-CM | POA: Insufficient documentation

## 2015-05-02 DIAGNOSIS — Z7982 Long term (current) use of aspirin: Secondary | ICD-10-CM | POA: Diagnosis not present

## 2015-05-02 DIAGNOSIS — Z1211 Encounter for screening for malignant neoplasm of colon: Secondary | ICD-10-CM

## 2015-05-02 DIAGNOSIS — E119 Type 2 diabetes mellitus without complications: Secondary | ICD-10-CM | POA: Insufficient documentation

## 2015-05-02 DIAGNOSIS — Z800 Family history of malignant neoplasm of digestive organs: Secondary | ICD-10-CM

## 2015-05-02 DIAGNOSIS — Z79899 Other long term (current) drug therapy: Secondary | ICD-10-CM | POA: Diagnosis not present

## 2015-05-02 HISTORY — PX: COLONOSCOPY: SHX5424

## 2015-05-02 LAB — GLUCOSE, CAPILLARY: GLUCOSE-CAPILLARY: 92 mg/dL (ref 65–99)

## 2015-05-02 SURGERY — COLONOSCOPY
Anesthesia: Moderate Sedation

## 2015-05-02 MED ORDER — MEPERIDINE HCL 50 MG/ML IJ SOLN
INTRAMUSCULAR | Status: DC | PRN
  Administered 2015-05-02 (×4): 25 mg via INTRAVENOUS

## 2015-05-02 MED ORDER — MIDAZOLAM HCL 5 MG/5ML IJ SOLN
INTRAMUSCULAR | Status: DC | PRN
  Administered 2015-05-02 (×2): 2 mg via INTRAVENOUS
  Administered 2015-05-02 (×2): 3 mg via INTRAVENOUS

## 2015-05-02 MED ORDER — SIMETHICONE 40 MG/0.6ML PO SUSP
ORAL | Status: DC | PRN
  Administered 2015-05-02: 13:00:00

## 2015-05-02 MED ORDER — MIDAZOLAM HCL 5 MG/5ML IJ SOLN
INTRAMUSCULAR | Status: AC
  Filled 2015-05-02: qty 10

## 2015-05-02 MED ORDER — MEPERIDINE HCL 50 MG/ML IJ SOLN
INTRAMUSCULAR | Status: AC
  Filled 2015-05-02: qty 1

## 2015-05-02 MED ORDER — SODIUM CHLORIDE 0.9 % IV SOLN
INTRAVENOUS | Status: DC
  Administered 2015-05-02: 12:00:00 via INTRAVENOUS

## 2015-05-02 NOTE — Op Note (Signed)
COLONOSCOPY PROCEDURE REPORT  PATIENT:  Julia Wang  MR#:  [id number] Birthdate:  01-Jan-1961, 54 y.o., female Endoscopist:  Dr. Rogene Houston, MD Referred By:  Debbra Riding, MD Procedure Date: 05/02/2015  Procedure:   Colonoscopy  Indications:  Patient is 54 year old Caucasian female who is undergoing average risk screening colonoscopy. Family history is positive for colon carcinoma in her mother but she was 22 at the time of diagnosis.  Informed Consent:  The procedure and risks were reviewed with the patient and informed consent was obtained.  Medications:  Demerol 100 mg IV Versed 10 mg IV  Description of procedure:  After a digital rectal exam was performed, that colonoscope was advanced from the anus through the rectum and colon to the area of the cecum, ileocecal valve and appendiceal orifice. The cecum was deeply intubated. These structures were well-seen and photographed for the record. From the level of the cecum and ileocecal valve, the scope was slowly and cautiously withdrawn. The mucosal surfaces were carefully surveyed utilizing scope tip to flexion to facilitate fold flattening as needed. The scope was pulled down into the rectum where a thorough exam including retroflexion was performed.  Findings:   Prep satisfactory. Normal mucosa of cecum, ascending colon, hepatic flexure, transverse colon, splenic flexure, descending and sigmoid colon. Normal rectal mucosa. Small hemorrhoids below the dentate line.    Therapeutic/Diagnostic Maneuvers Performed:   None  Complications:  None  EBL: None  Cecal Withdrawal Time:  12  minutes  Impression:  Examination performed to cecum. Normal colonoscopy except small external hemorrhoids.  Recommendations:  Standard instructions given. Next screening exam in 10 years.   Salley Boxley U  05/02/2015 1:25 PM  CC: Dr. Wende Neighbors, MD & Dr. Rayne Du ref. provider found

## 2015-05-02 NOTE — Discharge Instructions (Signed)
Resume usual medications and diet. °No driving for 24 hours. °Next screening exam in 10 years. ° ° ° ° ° °Colonoscopy, Care After °These instructions give you information on caring for yourself after your procedure. Your doctor may also give you more specific instructions. Call your doctor if you have any problems or questions after your procedure. °HOME CARE °· Do not drive for 24 hours. °· Do not sign important papers or use machinery for 24 hours. °· You may shower. °· You may go back to your usual activities, but go slower for the first 24 hours. °· Take rest breaks often during the first 24 hours. °· Walk around or use warm packs on your belly (abdomen) if you have belly cramping or gas. °· Drink enough fluids to keep your pee (urine) clear or pale yellow. °· Resume your normal diet. Avoid heavy or fried foods. °· Avoid drinking alcohol for 24 hours or as told by your doctor. °· Only take medicines as told by your doctor. °If a tissue sample (biopsy) was taken during the procedure:  °· Do not take aspirin or blood thinners for 7 days, or as told by your doctor. °· Do not drink alcohol for 7 days, or as told by your doctor. °· Eat soft foods for the first 24 hours. °GET HELP IF: °You still have a small amount of blood in your poop (stool) 2-3 days after the procedure. °GET HELP RIGHT AWAY IF: °· You have more than a small amount of blood in your poop. °· You see clumps of tissue (blood clots) in your poop. °· Your belly is puffy (swollen). °· You feel sick to your stomach (nauseous) or throw up (vomit). °· You have a fever. °· You have belly pain that gets worse and medicine does not help. °MAKE SURE YOU: °· Understand these instructions. °· Will watch your condition. °· Will get help right away if you are not doing well or get worse. °  °This information is not intended to replace advice given to you by your health care provider. Make sure you discuss any questions you have with your health care provider. °    °Document Released: 07/04/2010 Document Revised: 06/06/2013 Document Reviewed: 02/06/2013 °Elsevier Interactive Patient Education ©2016 [employer] ° °

## 2015-05-02 NOTE — H&P (Signed)
Julia Wang is an 54 y.o. female.   Chief Complaint: Patient is here for colonoscopy. HPI: Patient is 54 year old Caucasian female was here for screening colonoscopy. She denies abdominal pain rectal bleeding. She has had intermittent diarrhea since she has been on metformin. Family history significant for CRC in mother who was 46 at the time of diagnosis and died within 4 months.  Past Medical History  Diagnosis Date  . Diabetes mellitus without complication [facility])     Past Surgical History  Procedure Laterality Date  . Cholecystectomy    . Cesarean section  1986    Family History  Problem Relation Age of Onset  . Cancer Mother    Social History:  reports that she has never smoked. She does not have any smokeless tobacco history on file. She reports that she does not drink alcohol. Her drug history is not on file.  Allergies:  Allergies  Allergen Reactions  . Daypro [Oxaprozin]     hives    Medications Prior to Admission  Medication Sig Dispense Refill  . ALPRAZolam (XANAX) 0.5 MG tablet Take 0.5 mg by mouth at bedtime as needed for anxiety.    Marland Kitchen aspirin EC 81 MG tablet Take 81 mg by mouth daily.    Marland Kitchen BIOTIN PO Take 1 tablet by mouth daily.    Marland Kitchen CINNAMON PO Take 1 tablet by mouth 2 (two) times daily.    . diphenhydrAMINE (BENADRYL) 25 mg capsule Take 50 mg by mouth at bedtime.    . metFORMIN (GLUCOPHAGE) 500 MG tablet Take by mouth 2 (two) times daily with a meal.    . Multiple Vitamins-Minerals (MULTIVITAMIN ADULT PO) Take 1 tablet by mouth daily.    . Omega-3 Fatty Acids (FISH OIL PO) Take 1 capsule by mouth daily.    . sertraline (ZOLOFT) 50 MG tablet Take 50 mg by mouth daily.    Manus Gunning BOWEL PREP SOLN Take 1 kit by mouth once. 1 Bottle 0  . megestrol (MEGACE) 40 MG tablet 3 tablets a day for 5 days, 2 tablets a day for 5 days then 1 tablet daily 45 tablet 11  . silver sulfADIAZINE (SILVADENE) 1 % cream Apply to area 2 to 3 times daily (Patient not taking: Reported  on 11/15/2014) 50 g 11    No results found for this or any previous visit (from the past 48 hour(s)). No results found.  ROS  Blood pressure 143/76, pulse 92, temperature 98.6 F (37 C), temperature source Oral, resp. rate 16, height 5' 9.5" (1.765 m), weight 213 lb (96.616 kg), SpO2 100 %. Physical Exam  Constitutional: She appears well-developed and well-nourished.  HENT:  Mouth/Throat: Oropharynx is clear and moist.  Eyes: Conjunctivae are normal. No scleral icterus.  Neck: No thyromegaly present.  Cardiovascular: Normal rate, regular rhythm and normal heart sounds.   No murmur heard. Respiratory: Effort normal and breath sounds normal.  GI: Soft. She exhibits no distension and no mass. There is no tenderness.  Musculoskeletal: She exhibits no edema.  Lymphadenopathy:    She has no cervical adenopathy.  Neurological: She is alert.  Skin: Skin is warm and dry.     Assessment/Plan Average risk screening colonoscopy. Family history of CRC in mother at late onset(diagnosed at age 87).  Julia Wang U 05/02/2015, 12:49 PM

## 2015-05-06 ENCOUNTER — Encounter (HOSPITAL_COMMUNITY): Payer: Self-pay | Admitting: Internal Medicine

## 2015-05-20 ENCOUNTER — Encounter: Payer: Self-pay | Admitting: Obstetrics & Gynecology

## 2015-05-20 ENCOUNTER — Ambulatory Visit (INDEPENDENT_AMBULATORY_CARE_PROVIDER_SITE_OTHER): Payer: 59 | Admitting: Obstetrics & Gynecology

## 2015-05-20 ENCOUNTER — Other Ambulatory Visit (HOSPITAL_COMMUNITY)
Admission: RE | Admit: 2015-05-20 | Discharge: 2015-05-20 | Disposition: A | Discharge status: Home / Self Care | Payer: 59 | Source: Ambulatory Visit | Attending: Obstetrics & Gynecology | Admitting: Obstetrics & Gynecology

## 2015-05-20 VITALS — BP 140/90 | HR 72 | Ht 69.0 in | Wt 222.0 lb

## 2015-05-20 DIAGNOSIS — Z01419 Encounter for gynecological examination (general) (routine) without abnormal findings: Secondary | ICD-10-CM | POA: Diagnosis not present

## 2015-05-20 MED ORDER — MEDROXYPROGESTERONE ACETATE 10 MG PO TABS: 10.0000 mg | ORAL_TABLET | Freq: Every day | ORAL | Status: DC

## 2015-05-20 NOTE — Progress Notes (Signed)
Patient ID: Julia Wang, female   DOB: 24-May-1961, 54 y.o.   MRN: [id number] Subjective:     Julia Wang is a 54 y.o. female here for a routine exam.  Patient's last menstrual period was 04/16/2015. No obstetric history on file. Birth Control Method:  vasectomy Menstrual Calendar(currently): irregular, peri menopausal  Current complaints: none.   Current acute medical issues:  Recurrent UTI, will make referral to [facility]   Recent Gynecologic History Patient's last menstrual period was 04/16/2015. Last Pap: 2015,  normal Last mammogram: 2016,  normal  Past Medical History  Diagnosis Date  . Diabetes mellitus without complication [facility])     Past Surgical History  Procedure Laterality Date  . Cholecystectomy    . Cesarean section  1986  . Colonoscopy N/A 05/02/2015    Procedure: COLONOSCOPY;  Surgeon: Rogene Houston, MD;  Location: [facility];  Service: Endoscopy;  Laterality: N/A;  1030    OB History    No data available      Social History   Social History  . Marital Status: Married    Spouse Name: N/A  . Number of Children: N/A  . Years of Education: N/A   Social History Main Topics  . Smoking status: Never Smoker   . Smokeless tobacco: Not on file  . Alcohol Use: No  . Drug Use: Not on file  . Sexual Activity: Not on file   Other Topics Concern  . Not on file   Social History Narrative    Family History  Problem Relation Age of Onset  . Cancer Mother      Current outpatient prescriptions:  .  ALPRAZolam (XANAX) 0.5 MG tablet, Take 0.5 mg by mouth at bedtime as needed for anxiety., Disp: , Rfl:  .  aspirin EC 81 MG tablet, Take 81 mg by mouth daily., Disp: , Rfl:  .  BIOTIN PO, Take 1 tablet by mouth daily., Disp: , Rfl:  .  CINNAMON PO, Take 1 tablet by mouth 2 (two) times daily., Disp: , Rfl:  .  CRANBERRY-VITAMIN C PO, Take by mouth., Disp: , Rfl:  .  diphenhydrAMINE (BENADRYL) 25 mg capsule, Take 50 mg by mouth at bedtime., Disp: ,  Rfl:  .  metFORMIN (GLUCOPHAGE) 500 MG tablet, Take 1,000 mg by mouth 2 (two) times daily with a meal., Disp: , Rfl:  .  Multiple Vitamins-Minerals (MULTIVITAMIN ADULT PO), Take 1 tablet by mouth daily., Disp: , Rfl:  .  Omega-3 Fatty Acids (FISH OIL PO), Take 1 capsule by mouth daily., Disp: , Rfl:  .  sertraline (ZOLOFT) 50 MG tablet, Take 50 mg by mouth daily., Disp: , Rfl:  .  medroxyPROGESTERone (PROVERA) 10 MG tablet, Take 1 tablet (10 mg total) by mouth daily., Disp: 30 tablet, Rfl: 11  Review of Systems  Review of Systems  Constitutional: Negative for fever, chills, weight loss, malaise/fatigue and diaphoresis.  HENT: Negative for hearing loss, ear pain, nosebleeds, congestion, sore throat, neck pain, tinnitus and ear discharge.   Eyes: Negative for blurred vision, double vision, photophobia, pain, discharge and redness.  Respiratory: Negative for cough, hemoptysis, sputum production, shortness of breath, wheezing and stridor.   Cardiovascular: Negative for chest pain, palpitations, orthopnea, claudication, leg swelling and PND.  Gastrointestinal: negative for abdominal pain. Negative for heartburn, nausea, vomiting, diarrhea, constipation, blood in stool and melena.  Genitourinary: Negative for dysuria, urgency, frequency, hematuria and flank pain.  Musculoskeletal: Negative for myalgias, back pain, joint pain and falls.  Skin: Negative for itching and rash.  Neurological: Negative for dizziness, tingling, tremors, sensory change, speech change, focal weakness, seizures, loss of consciousness, weakness and headaches.  Endo/Heme/Allergies: Negative for environmental allergies and polydipsia. Does not bruise/bleed easily.  Psychiatric/Behavioral: Negative for depression, suicidal ideas, hallucinations, memory loss and substance abuse. The patient is not nervous/anxious and does not have insomnia.        Objective:  Blood pressure 140/90, pulse 72, height 5\' 9"  (1.753 m), weight 222  lb (100.699 kg), last menstrual period 04/16/2015.   Physical Exam  Vitals reviewed. Constitutional: She is oriented to person, place, and time. She appears well-developed and well-nourished.  HENT:  Head: Normocephalic and atraumatic.        Right Ear: External ear normal.  Left Ear: External ear normal.  Nose: Nose normal.  Mouth/Throat: Oropharynx is clear and moist.  Eyes: Conjunctivae and EOM are normal. Pupils are equal, round, and reactive to light. Right eye exhibits no discharge. Left eye exhibits no discharge. No scleral icterus.  Neck: Normal range of motion. Neck supple. No tracheal deviation present. No thyromegaly present.  Cardiovascular: Normal rate, regular rhythm, normal heart sounds and intact distal pulses.  Exam reveals no gallop and no friction rub.   No murmur heard. Respiratory: Effort normal and breath sounds normal. No respiratory distress. She has no wheezes. She has no rales. She exhibits no tenderness.  GI: Soft. Bowel sounds are normal. She exhibits no distension and no mass. There is no tenderness. There is no rebound and no guarding.  Genitourinary:  Breasts no masses skin changes or nipple changes bilaterally      Vulva is normal without lesions Vagina is pink moist without discharge, midurethral urethral diverticulum is stable Cervix normal in appearance and pap is done Uterus is normal size shape and contour Adnexa is negative with normal sized ovaries  Pt had colonoscopy 1 month ago Musculoskeletal: Normal range of motion. She exhibits no edema and no tenderness.  Neurological: She is alert and oriented to person, place, and time. She has normal reflexes. She displays normal reflexes. No cranial nerve deficit. She exhibits normal muscle tone. Coordination normal.  Skin: Skin is warm and dry. No rash noted. No erythema. No pallor.  Psychiatric: She has a normal mood and affect. Her behavior is normal. Judgment and thought content normal.        Assessment:    Healthy female exam.   urethral diverticulum with recurrent UTIs over the past year Plan:    Mammogram ordered. Follow up in: 1 year. Provera 10 days monthly, will refer to [facility] for evalauation and probably surgical management of a mid urethral diverticulum

## 2015-05-21 LAB — CYTOLOGY - PAP

## 2015-09-19 DIAGNOSIS — E782 Mixed hyperlipidemia: Secondary | ICD-10-CM | POA: Diagnosis not present

## 2015-09-19 DIAGNOSIS — E119 Type 2 diabetes mellitus without complications: Secondary | ICD-10-CM | POA: Diagnosis not present

## 2015-09-19 DIAGNOSIS — I10 Essential (primary) hypertension: Secondary | ICD-10-CM | POA: Diagnosis not present

## 2015-09-19 DIAGNOSIS — F410 Panic disorder [episodic paroxysmal anxiety] without agoraphobia: Secondary | ICD-10-CM | POA: Diagnosis not present

## 2015-10-29 ENCOUNTER — Other Ambulatory Visit: Payer: Self-pay | Admitting: Obstetrics & Gynecology

## 2015-10-29 DIAGNOSIS — Z1231 Encounter for screening mammogram for malignant neoplasm of breast: Secondary | ICD-10-CM

## 2015-11-07 ENCOUNTER — Ambulatory Visit (HOSPITAL_COMMUNITY)
Admission: RE | Admit: 2015-11-07 | Discharge: 2015-11-07 | Disposition: A | Discharge status: Home / Self Care | Payer: BLUE CROSS/BLUE SHIELD | Source: Ambulatory Visit | Attending: Obstetrics & Gynecology | Admitting: Obstetrics & Gynecology

## 2015-11-07 DIAGNOSIS — Z1231 Encounter for screening mammogram for malignant neoplasm of breast: Secondary | ICD-10-CM

## 2015-11-14 DIAGNOSIS — N390 Urinary tract infection, site not specified: Secondary | ICD-10-CM | POA: Diagnosis not present

## 2015-11-14 DIAGNOSIS — N3001 Acute cystitis with hematuria: Secondary | ICD-10-CM | POA: Diagnosis not present

## 2015-11-21 DIAGNOSIS — N3001 Acute cystitis with hematuria: Secondary | ICD-10-CM | POA: Diagnosis not present

## 2016-01-07 DIAGNOSIS — E119 Type 2 diabetes mellitus without complications: Secondary | ICD-10-CM | POA: Diagnosis not present

## 2016-01-07 DIAGNOSIS — E782 Mixed hyperlipidemia: Secondary | ICD-10-CM | POA: Diagnosis not present

## 2016-01-07 DIAGNOSIS — D509 Iron deficiency anemia, unspecified: Secondary | ICD-10-CM | POA: Diagnosis not present

## 2016-01-14 DIAGNOSIS — I10 Essential (primary) hypertension: Secondary | ICD-10-CM | POA: Diagnosis not present

## 2016-01-14 DIAGNOSIS — E119 Type 2 diabetes mellitus without complications: Secondary | ICD-10-CM | POA: Diagnosis not present

## 2016-01-14 DIAGNOSIS — E782 Mixed hyperlipidemia: Secondary | ICD-10-CM | POA: Diagnosis not present

## 2016-02-06 ENCOUNTER — Other Ambulatory Visit: Payer: Self-pay | Admitting: Obstetrics & Gynecology

## 2016-05-16 ENCOUNTER — Emergency Department (HOSPITAL_COMMUNITY): Payer: BLUE CROSS/BLUE SHIELD

## 2016-05-16 ENCOUNTER — Encounter (HOSPITAL_COMMUNITY): Payer: Self-pay | Admitting: Emergency Medicine

## 2016-05-16 ENCOUNTER — Emergency Department (HOSPITAL_COMMUNITY)
Admission: EM | Admit: 2016-05-16 | Discharge: 2016-05-16 | Disposition: A | Discharge status: Home / Self Care | Payer: BLUE CROSS/BLUE SHIELD | Attending: Emergency Medicine | Admitting: Emergency Medicine

## 2016-05-16 DIAGNOSIS — Z7984 Long term (current) use of oral hypoglycemic drugs: Secondary | ICD-10-CM | POA: Insufficient documentation

## 2016-05-16 DIAGNOSIS — E119 Type 2 diabetes mellitus without complications: Secondary | ICD-10-CM | POA: Diagnosis not present

## 2016-05-16 DIAGNOSIS — Z7982 Long term (current) use of aspirin: Secondary | ICD-10-CM | POA: Insufficient documentation

## 2016-05-16 DIAGNOSIS — Z79899 Other long term (current) drug therapy: Secondary | ICD-10-CM | POA: Diagnosis not present

## 2016-05-16 DIAGNOSIS — I471 Supraventricular tachycardia: Secondary | ICD-10-CM | POA: Insufficient documentation

## 2016-05-16 DIAGNOSIS — R079 Chest pain, unspecified: Secondary | ICD-10-CM | POA: Diagnosis not present

## 2016-05-16 DIAGNOSIS — R0602 Shortness of breath: Secondary | ICD-10-CM | POA: Diagnosis not present

## 2016-05-16 DIAGNOSIS — I499 Cardiac arrhythmia, unspecified: Secondary | ICD-10-CM | POA: Diagnosis not present

## 2016-05-16 DIAGNOSIS — I472 Ventricular tachycardia: Secondary | ICD-10-CM | POA: Diagnosis not present

## 2016-05-16 LAB — COMPREHENSIVE METABOLIC PANEL
ALT: 21 U/L (ref 14–54)
ANION GAP: 11 (ref 5–15)
AST: 23 U/L (ref 15–41)
Albumin: 4.9 g/dL (ref 3.5–5.0)
Alkaline Phosphatase: 62 U/L (ref 38–126)
BUN: 9 mg/dL (ref 6–20)
CALCIUM: 10.1 mg/dL (ref 8.9–10.3)
CHLORIDE: 102 mmol/L (ref 101–111)
CO2: 25 mmol/L (ref 22–32)
CREATININE: 0.71 mg/dL (ref 0.44–1.00)
Glucose, Bld: 117 mg/dL — ABNORMAL HIGH (ref 65–99)
Potassium: 3.4 mmol/L — ABNORMAL LOW (ref 3.5–5.1)
Sodium: 138 mmol/L (ref 135–145)
Total Bilirubin: 1.2 mg/dL (ref 0.3–1.2)
Total Protein: 8.7 g/dL — ABNORMAL HIGH (ref 6.5–8.1)

## 2016-05-16 LAB — CBC WITH DIFFERENTIAL/PLATELET
Basophils Absolute: 0 10*3/uL (ref 0.0–0.1)
Basophils Relative: 0 %
EOS PCT: 2 %
Eosinophils Absolute: 0.2 10*3/uL (ref 0.0–0.7)
HCT: 51.4 % — ABNORMAL HIGH (ref 36.0–46.0)
Hemoglobin: 17.6 g/dL — ABNORMAL HIGH (ref 12.0–15.0)
LYMPHS ABS: 3.3 10*3/uL (ref 0.7–4.0)
LYMPHS PCT: 27 %
MCH: 30.4 pg (ref 26.0–34.0)
MCHC: 34.2 g/dL (ref 30.0–36.0)
MCV: 88.9 fL (ref 78.0–100.0)
MONO ABS: 0.9 10*3/uL (ref 0.1–1.0)
MONOS PCT: 8 %
Neutro Abs: 7.7 10*3/uL (ref 1.7–7.7)
Neutrophils Relative %: 63 %
PLATELETS: 348 10*3/uL (ref 150–400)
RBC: 5.78 MIL/uL — AB (ref 3.87–5.11)
RDW: 15.8 % — ABNORMAL HIGH (ref 11.5–15.5)
WBC: 12.3 10*3/uL — ABNORMAL HIGH (ref 4.0–10.5)

## 2016-05-16 MED ORDER — ADENOSINE 6 MG/2ML IV SOLN
INTRAVENOUS | Status: AC
  Filled 2016-05-16: qty 4

## 2016-05-16 NOTE — ED Triage Notes (Signed)
Patient tried vagal maneuver with no change in rate/rhythm.

## 2016-05-16 NOTE — ED Provider Notes (Signed)
[facility] DEPT Provider Note   CSN: [id number] Arrival date & time: 05/16/16  1116  By signing my name below, I, Dolores Hoose, attest that this documentation has been prepared under the direction and in the presence of Milton Ferguson, MD . Electronically Signed: Dolores Hoose, Scribe. 05/16/2016. 11:27 AM.  History   Chief Complaint Chief Complaint  Patient presents with  . Irregular Heart Beat   The history is provided by the patient. No language interpreter was used.  Palpitations   This is a new problem. The current episode started 1 to 2 hours ago. The problem occurs constantly. The problem has not changed since onset.The problem is associated with an unknown factor. Pertinent negatives include no chest pain, no abdominal pain, no headaches, no back pain and no cough. Risk factors include diabetes mellitus.    HPI Comments:  Julia Wang is a 55 y.o. female with pmhx of DM who presents to the Emergency Department complaining of sudden-onset constant irregular heartbeat beginning earlier today. Pt was being seen earlier today at Dr. Durene Cal office and was told to come to ED. Pt describes her symptoms as a feeling of a "shock" followed by heart palpitations. She notes this has happened before in the past about 12 years ago and that it went away on its own.  Pt denies any other symptoms  Past Medical History:  Diagnosis Date  . Diabetes mellitus without complication [facility])     Patient Active Problem List   Diagnosis Date Noted  . Type 2 diabetes mellitus ([place]) 05/02/2013  . Urethral diverticulum 05/02/2013    Past Surgical History:  Procedure Laterality Date  . CESAREAN SECTION  1986  . CHOLECYSTECTOMY    . COLONOSCOPY N/A 05/02/2015   Procedure: COLONOSCOPY;  Surgeon: Rogene Houston, MD;  Location: [facility];  Service: Endoscopy;  Laterality: N/A;  32    OB History    Gravida Para Term Preterm AB Living   2 1 1   1 1    SAB TAB Ectopic Multiple Live Births   1                Home Medications    Prior to Admission medications   Medication Sig Start Date End Date Taking? Authorizing Provider  ALPRAZolam Duanne Moron) 0.5 MG tablet Take 0.5 mg by mouth at bedtime as needed for anxiety.    Historical Provider, MD  aspirin EC 81 MG tablet Take 81 mg by mouth daily.    Historical Provider, MD  BIOTIN PO Take 1 tablet by mouth daily.    Historical Provider, MD  CINNAMON PO Take 1 tablet by mouth 2 (two) times daily.    Historical Provider, MD  CRANBERRY-VITAMIN C PO Take by mouth.    Historical Provider, MD  diphenhydrAMINE (BENADRYL) 25 mg capsule Take 50 mg by mouth at bedtime.    Historical Provider, MD  medroxyPROGESTERone (PROVERA) 10 MG tablet Take 1 tablet (10 mg total) by mouth daily. 05/20/15   Florian Buff, MD  megestrol (MEGACE) 40 MG tablet TAKE 3 TABLETS A DAY FOR 5 DAYS, THEN 2 TABLETS DAILY FOR 5 DAYS, THEN ONE TABLET DAILY 02/06/16   Florian Buff, MD  metFORMIN (GLUCOPHAGE) 500 MG tablet Take 1,000 mg by mouth 2 (two) times daily with a meal.    Historical Provider, MD  Multiple Vitamins-Minerals (MULTIVITAMIN ADULT PO) Take 1 tablet by mouth daily.    Historical Provider, MD  Omega-3 Fatty Acids (FISH OIL PO) Take 1  capsule by mouth daily.    Historical Provider, MD  sertraline (ZOLOFT) 50 MG tablet Take 50 mg by mouth daily.    Historical Provider, MD    Family History Family History  Problem Relation Age of Onset  . Cancer Mother   . Heart failure Father     Social History Social History  Substance Use Topics  . Smoking status: Never Smoker  . Smokeless tobacco: Never Used  . Alcohol use No     Allergies   Daypro [oxaprozin]   Review of Systems Review of Systems  Constitutional: Negative for appetite change and fatigue.  HENT: Negative for congestion, ear discharge and sinus pressure.   Eyes: Negative for discharge.  Respiratory: Negative for cough.   Cardiovascular: Positive for palpitations. Negative for chest pain.    Gastrointestinal: Negative for abdominal pain and diarrhea.  Genitourinary: Negative for frequency and hematuria.  Musculoskeletal: Negative for back pain.  Skin: Negative for rash.  Neurological: Negative for seizures and headaches.  Psychiatric/Behavioral: Negative for hallucinations.     Physical Exam Updated Vital Signs BP (!) 175/119 (BP Location: Right Arm)   Pulse (!) 161   Temp 98.1 F (36.7 C) (Oral)   Resp 22   Ht 5' 9.5" (1.765 m)   Wt 194 lb (88 kg)   LMP 04/26/2016   SpO2 97%   BMI 28.24 kg/m   Physical Exam  Constitutional: She is oriented to person, place, and time. She appears well-developed.  HENT:  Head: Normocephalic.  Eyes: Conjunctivae and EOM are normal. No scleral icterus.  Neck: Neck supple. No thyromegaly present.  Cardiovascular: Regular rhythm.  Tachycardia present.  Exam reveals no gallop and no friction rub.   No murmur heard. Rapid regular rate  Pulmonary/Chest: No stridor. She has no wheezes. She has no rales. She exhibits no tenderness.  Abdominal: She exhibits no distension. There is no tenderness. There is no rebound.  Musculoskeletal: Normal range of motion. She exhibits no edema.  Lymphadenopathy:    She has no cervical adenopathy.  Neurological: She is oriented to person, place, and time. She exhibits normal muscle tone. Coordination normal.  Skin: No rash noted. No erythema.  Psychiatric: She has a normal mood and affect. Her behavior is normal.     ED Treatments / Results  DIAGNOSTIC STUDIES:  Oxygen Saturation is 97% on RA, normal by my interpretation.    COORDINATION OF CARE:  11:33 AM Discussed treatment plan with pt at bedside which includes adenosine and pt agreed to plan.  Labs (all labs ordered are listed, but only abnormal results are displayed) Labs Reviewed - No data to display  EKG  EKG Interpretation None       Radiology No results found.  Procedures Procedures (including critical care  time)  Medications Ordered in ED Medications - No data to display   Initial Impression / Assessment and Plan / ED Course  I have reviewed the triage vital signs and the nursing notes.  Pertinent labs & imaging results that were available during my care of the patient were reviewed by me and considered in my medical decision making (see chart for details).  Clinical Course     CRITICAL CARE Performed by: Curtis Cain L Total critical care time:35 minutes Critical care time was exclusive of separately billable procedures and treating other patients. Critical care was necessary to treat or prevent imminent or life-threatening deterioration. Critical care was time spent personally by me on the following activities: development of treatment plan with  patient and/or surrogate as well as nursing, discussions with consultants, evaluation of patient's response to treatment, examination of patient, obtaining history from patient or surrogate, ordering and performing treatments and interventions, ordering and review of laboratory studies, ordering and review of radiographic studies, pulse oximetry and re-evaluation of patient's condition.  Patient was given 6, [place]. She responded quickly and her heart rate dropped from 170 down to 110 without any complications Final Clinical Impressions(s) / ED Diagnoses   Final diagnoses:  None  Patient with SVT normal blood work normal chest x-ray. Patient does have minor tachycardia on EKg    at discharge she was still tachycardic around 103. Patient states she feels anxious. Patient will follow-up with PCP  New Prescriptions New Prescriptions   No medications on file      Milton Ferguson, MD 05/16/16 1338

## 2016-05-16 NOTE — Discharge Instructions (Signed)
Follow-up with your family doctor in 1-2 weeks for recheck. If you have tachycardia again please come back to the hospital

## 2016-05-16 NOTE — ED Triage Notes (Signed)
Patient sent from Dr Juel Burrow office. Patient was c/o "heart racing." EKG done with HR 160 and rhythm SVT. Per patient has had similar episode years ago but went away on its on. Denies any chest pain or shortness of breath.

## 2016-05-25 DIAGNOSIS — I471 Supraventricular tachycardia: Secondary | ICD-10-CM | POA: Diagnosis not present

## 2016-05-25 DIAGNOSIS — Z6828 Body mass index (BMI) 28.0-28.9, adult: Secondary | ICD-10-CM | POA: Diagnosis not present

## 2016-05-26 ENCOUNTER — Encounter: Payer: Self-pay | Admitting: Obstetrics & Gynecology

## 2016-05-26 ENCOUNTER — Ambulatory Visit (INDEPENDENT_AMBULATORY_CARE_PROVIDER_SITE_OTHER): Payer: BLUE CROSS/BLUE SHIELD | Admitting: Obstetrics & Gynecology

## 2016-05-26 ENCOUNTER — Other Ambulatory Visit (HOSPITAL_COMMUNITY)
Admission: RE | Admit: 2016-05-26 | Discharge: 2016-05-26 | Disposition: A | Discharge status: Home / Self Care | Payer: BLUE CROSS/BLUE SHIELD | Source: Ambulatory Visit | Attending: Obstetrics & Gynecology | Admitting: Obstetrics & Gynecology

## 2016-05-26 VITALS — BP 120/80 | HR 92 | Ht 69.2 in | Wt 194.0 lb

## 2016-05-26 DIAGNOSIS — Z1151 Encounter for screening for human papillomavirus (HPV): Secondary | ICD-10-CM | POA: Diagnosis not present

## 2016-05-26 DIAGNOSIS — E039 Hypothyroidism, unspecified: Secondary | ICD-10-CM | POA: Diagnosis not present

## 2016-05-26 DIAGNOSIS — Z01411 Encounter for gynecological examination (general) (routine) with abnormal findings: Secondary | ICD-10-CM | POA: Diagnosis not present

## 2016-05-26 DIAGNOSIS — Z01419 Encounter for gynecological examination (general) (routine) without abnormal findings: Secondary | ICD-10-CM | POA: Diagnosis not present

## 2016-05-26 MED ORDER — MEDROXYPROGESTERONE ACETATE 2.5 MG PO TABS: 2.5000 mg | ORAL_TABLET | Freq: Every day | ORAL | 11 refills | Status: DC

## 2016-05-26 MED ORDER — ESTRADIOL 0.1 MG/24HR TD PTTW: 1.0000 | MEDICATED_PATCH | TRANSDERMAL | 12 refills | Status: DC

## 2016-05-26 NOTE — Progress Notes (Signed)
Subjective:     Julia Wang is a 55 y.o. female here for a routine exam.  Patient's last menstrual period was 04/26/2016. [id number] Birth Control Method:  menopause Menstrual Calendar(currently): irregular  Current complaints: hair thinning, cold intolerance, sexual response dysfunction.   Current acute medical issues:  none   Recent Gynecologic History Patient's last menstrual period was 04/26/2016. Last Pap: 2017,  normal Last mammogram: 10/2015,  normal  Past Medical History:  Diagnosis Date  . Diabetes mellitus without complication [facility])     Past Surgical History:  Procedure Laterality Date  . CESAREAN SECTION  1986  . CHOLECYSTECTOMY    . COLONOSCOPY N/A 05/02/2015   Procedure: COLONOSCOPY;  Surgeon: Rogene Houston, MD;  Location: [facility];  Service: Endoscopy;  Laterality: N/A;  60    OB History    Gravida Para Term Preterm AB Living   2 1 1   1 1    SAB TAB Ectopic Multiple Live Births   1              Social History   Social History  . Marital status: Married    Spouse name: N/A  . Number of children: N/A  . Years of education: N/A   Social History Main Topics  . Smoking status: Never Smoker  . Smokeless tobacco: Never Used  . Alcohol use No  . Drug use: No  . Sexual activity: Not Asked   Other Topics Concern  . None   Social History Narrative  . None    Family History  Problem Relation Age of Onset  . Cancer Mother   . Heart failure Father      Current Outpatient Prescriptions:  .  ALPRAZolam (XANAX) 0.5 MG tablet, Take 0.5 mg by mouth at bedtime as needed for anxiety., Disp: , Rfl:  .  aspirin EC 81 MG tablet, Take 81 mg by mouth daily., Disp: , Rfl:  .  BIOTIN PO, Take 1 tablet by mouth daily., Disp: , Rfl:  .  CINNAMON PO, Take 1 tablet by mouth 2 (two) times daily., Disp: , Rfl:  .  CRANBERRY-VITAMIN C PO, Take by mouth., Disp: , Rfl:  .  ferrous sulfate 325 (65 FE) MG tablet, Take 325 mg by mouth daily with breakfast., Disp: ,  Rfl:  .  megestrol (MEGACE) 40 MG tablet, TAKE 3 TABLETS A DAY FOR 5 DAYS, THEN 2 TABLETS DAILY FOR 5 DAYS, THEN ONE TABLET DAILY, Disp: 45 tablet, Rfl: 11 .  metFORMIN (GLUCOPHAGE) 500 MG tablet, Take 1,000 mg by mouth 2 (two) times daily with a meal., Disp: , Rfl:  .  Multiple Vitamins-Minerals (MULTIVITAMIN ADULT PO), Take 1 tablet by mouth daily., Disp: , Rfl:  .  Omega-3 Fatty Acids (FISH OIL PO), Take 1 capsule by mouth daily., Disp: , Rfl:  .  sertraline (ZOLOFT) 50 MG tablet, Take 50 mg by mouth daily., Disp: , Rfl:  .  diphenhydrAMINE (BENADRYL) 25 mg capsule, Take 50 mg by mouth at bedtime., Disp: , Rfl:  .  [START ON 05/28/2016] estradiol (VIVELLE-DOT) 0.1 MG/24HR patch, Place 1 patch (0.1 mg total) onto the skin 2 (two) times a week., Disp: 8 patch, Rfl: 12 .  medroxyPROGESTERone (PROVERA) 2.5 MG tablet, Take 1 tablet (2.5 mg total) by mouth daily., Disp: 30 tablet, Rfl: 11  Review of Systems  Review of Systems  Constitutional: Negative for fever, chills, weight loss, malaise/fatigue and diaphoresis.  HENT: Negative for hearing loss, ear pain, nosebleeds, congestion,  sore throat, neck pain, tinnitus and ear discharge.   Eyes: Negative for blurred vision, double vision, photophobia, pain, discharge and redness.  Respiratory: Negative for cough, hemoptysis, sputum production, shortness of breath, wheezing and stridor.   Cardiovascular: Negative for chest pain, palpitations, orthopnea, claudication, leg swelling and PND.  Gastrointestinal: negative for abdominal pain. Negative for heartburn, nausea, vomiting, diarrhea, constipation, blood in stool and melena.  Genitourinary: Negative for dysuria, urgency, frequency, hematuria and flank pain.  Musculoskeletal: Negative for myalgias, back pain, joint pain and falls.  Skin: Negative for itching and rash.  Neurological: Negative for dizziness, tingling, tremors, sensory change, speech change, focal weakness, seizures, loss of  consciousness, weakness and headaches.  Endo/Heme/Allergies: Negative for environmental allergies and polydipsia. Does not bruise/bleed easily.  Psychiatric/Behavioral: Negative for depression, suicidal ideas, hallucinations, memory loss and substance abuse. The patient is not nervous/anxious and does not have insomnia.        Objective:  Blood pressure 120/80, pulse 92, height 5' 9.2" (1.758 m), weight 194 lb (88 kg), last menstrual period 04/26/2016.   Physical Exam  Vitals reviewed. Constitutional: She is oriented to person, place, and time. She appears well-developed and well-nourished.  HENT:  Head: Normocephalic and atraumatic.        Right Ear: External ear normal.  Left Ear: External ear normal.  Nose: Nose normal.  Mouth/Throat: Oropharynx is clear and moist.  Eyes: Conjunctivae and EOM are normal. Pupils are equal, round, and reactive to light. Right eye exhibits no discharge. Left eye exhibits no discharge. No scleral icterus.  Neck: Normal range of motion. Neck supple. No tracheal deviation present. No thyromegaly present.  Cardiovascular: Normal rate, regular rhythm, normal heart sounds and intact distal pulses.  Exam reveals no gallop and no friction rub.   No murmur heard. Respiratory: Effort normal and breath sounds normal. No respiratory distress. She has no wheezes. She has no rales. She exhibits no tenderness.  GI: Soft. Bowel sounds are normal. She exhibits no distension and no mass. There is no tenderness. There is no rebound and no guarding.  Genitourinary:  Breasts no masses skin changes or nipple changes bilaterally      Vulva is normal without lesions Vagina is pink moist without discharge Cervix normal in appearance and pap is done Uterus is normal size shape and contour Adnexa is negative with normal sized ovaries   Musculoskeletal: Normal range of motion. She exhibits no edema and no tenderness.  Neurological: She is alert and oriented to person, place, and  time. She has normal reflexes. She displays normal reflexes. No cranial nerve deficit. She exhibits normal muscle tone. Coordination normal.  Skin: Skin is warm and dry. No rash noted. No erythema. No pallor.  Psychiatric: She has a normal mood and affect. Her behavior is normal. Judgment and thought content normal.       Medications Ordered at today's visit: Meds ordered this encounter  Medications  . ferrous sulfate 325 (65 FE) MG tablet    Sig: Take 325 mg by mouth daily with breakfast.  . estradiol (VIVELLE-DOT) 0.1 MG/24HR patch    Sig: Place 1 patch (0.1 mg total) onto the skin 2 (two) times a week.    Dispense:  8 patch    Refill:  12  . medroxyPROGESTERone (PROVERA) 2.5 MG tablet    Sig: Take 1 tablet (2.5 mg total) by mouth daily.    Dispense:  30 tablet    Refill:  11    Other orders placed at today's  visit: Orders Placed This Encounter  Procedures  . TSH      Assessment:    Healthy female exam.    Plan:    perimenopausal symptoms will start vivelle dot 0.1 mg + provera 2.5 mg daily     Return in about 3 months (around 08/24/2016) for Follow up, with Dr Elonda Husky.

## 2016-05-27 LAB — TSH: TSH: 3.07 u[IU]/mL (ref 0.450–4.500)

## 2016-05-28 LAB — CYTOLOGY - PAP
Adequacy: ABSENT
DIAGNOSIS: NEGATIVE
HPV (WINDOPATH): NOT DETECTED

## 2016-07-16 DIAGNOSIS — E782 Mixed hyperlipidemia: Secondary | ICD-10-CM | POA: Diagnosis not present

## 2016-07-16 DIAGNOSIS — E119 Type 2 diabetes mellitus without complications: Secondary | ICD-10-CM | POA: Diagnosis not present

## 2016-07-16 DIAGNOSIS — I10 Essential (primary) hypertension: Secondary | ICD-10-CM | POA: Diagnosis not present

## 2016-07-20 DIAGNOSIS — E119 Type 2 diabetes mellitus without complications: Secondary | ICD-10-CM | POA: Diagnosis not present

## 2016-07-20 DIAGNOSIS — E782 Mixed hyperlipidemia: Secondary | ICD-10-CM | POA: Diagnosis not present

## 2016-07-20 DIAGNOSIS — I10 Essential (primary) hypertension: Secondary | ICD-10-CM | POA: Diagnosis not present

## 2016-07-20 DIAGNOSIS — Z0000 Encounter for general adult medical examination without abnormal findings: Secondary | ICD-10-CM | POA: Diagnosis not present

## 2016-08-11 DIAGNOSIS — R42 Dizziness and giddiness: Secondary | ICD-10-CM | POA: Diagnosis not present

## 2016-08-11 DIAGNOSIS — Z6829 Body mass index (BMI) 29.0-29.9, adult: Secondary | ICD-10-CM | POA: Diagnosis not present

## 2016-08-25 ENCOUNTER — Ambulatory Visit: Payer: BLUE CROSS/BLUE SHIELD | Admitting: Obstetrics & Gynecology

## 2016-09-04 ENCOUNTER — Encounter: Payer: Self-pay | Admitting: Obstetrics & Gynecology

## 2016-09-04 ENCOUNTER — Encounter (INDEPENDENT_AMBULATORY_CARE_PROVIDER_SITE_OTHER): Payer: Self-pay

## 2016-09-04 ENCOUNTER — Ambulatory Visit (INDEPENDENT_AMBULATORY_CARE_PROVIDER_SITE_OTHER): Payer: BLUE CROSS/BLUE SHIELD | Admitting: Obstetrics & Gynecology

## 2016-09-04 VITALS — BP 132/88 | HR 78 | Wt 204.5 lb

## 2016-09-04 DIAGNOSIS — N951 Menopausal and female climacteric states: Secondary | ICD-10-CM

## 2016-09-04 DIAGNOSIS — N644 Mastodynia: Secondary | ICD-10-CM | POA: Diagnosis not present

## 2016-09-04 NOTE — Progress Notes (Signed)
Chief Complaint  Patient presents with  . Follow-up    Blood pressure 132/88, pulse 78, weight 204 lb 8 oz (92.8 kg), last menstrual period 08/07/2016.  56 y.o. G2P1011 Patient's last menstrual period was 08/07/2016 (approximate). The current method of family planning is post menopausal status.  Outpatient Encounter Prescriptions as of 09/04/2016  Medication Sig  . ALPRAZolam (XANAX) 0.5 MG tablet Take 0.5 mg by mouth at bedtime as needed for anxiety.  Marland Kitchen aspirin EC 81 MG tablet Take 81 mg by mouth daily.  Marland Kitchen atorvastatin (LIPITOR) 10 MG tablet Take 10 mg by mouth daily.  Marland Kitchen CINNAMON PO Take 1 tablet by mouth 2 (two) times daily.  Marland Kitchen CRANBERRY-VITAMIN C PO Take by mouth.  . estradiol (VIVELLE-DOT) 0.1 MG/24HR patch Place 1 patch (0.1 mg total) onto the skin 2 (two) times a week.  . medroxyPROGESTERone (PROVERA) 2.5 MG tablet Take 1 tablet (2.5 mg total) by mouth daily.  . sertraline (ZOLOFT) 50 MG tablet Take 50 mg by mouth daily.  . [DISCONTINUED] BIOTIN PO Take 1 tablet by mouth daily.  . [DISCONTINUED] diphenhydrAMINE (BENADRYL) 25 mg capsule Take 50 mg by mouth at bedtime.  . [DISCONTINUED] ferrous sulfate 325 (65 FE) MG tablet Take 325 mg by mouth daily with breakfast.  . [DISCONTINUED] megestrol (MEGACE) 40 MG tablet TAKE 3 TABLETS A DAY FOR 5 DAYS, THEN 2 TABLETS DAILY FOR 5 DAYS, THEN ONE TABLET DAILY  . [DISCONTINUED] metFORMIN (GLUCOPHAGE) 500 MG tablet Take 1,000 mg by mouth 2 (two) times daily with a meal.  . [DISCONTINUED] Multiple Vitamins-Minerals (MULTIVITAMIN ADULT PO) Take 1 tablet by mouth daily.  . [DISCONTINUED] Omega-3 Fatty Acids (FISH OIL PO) Take 1 capsule by mouth daily.   No facility-administered encounter medications on file as of 09/04/2016.     Subjective Pt is here for 2 reasons #1) see how she is doing on her HRT: she is on vivelle dot 0.1 mg and provera 2.5 mg daily, is having 2-3 days per month of dark dark spots when she wipes She also states  overall her bleeding is improved  Sexual dysfunction is also improved Her hair is growing in better  #2) concerned about an area in her left breast which remians sore and tener.  This is the same area as before, her mammogram from 10/2015 was normal   Objective Left breast:  fibroglandualr density is stable recommend get her screening imaging as scheduled, no axillary nodes  Pertinent ROS No burning with urination, frequency or urgency No nausea, vomiting or diarrhea Nor fever chills or other constitutional symptoms   Labs or studies TSH was normal    Impression Diagnoses this Encounter::   ICD-9-CM ICD-10-CM   1. Symptomatic menopausal or female climacteric states 627.2 N95.1   2. Breast tenderness in female 611.71 N64.4     Established relevant diagnosis(es):   Plan/Recommendations: Meds ordered this encounter  Medications  . atorvastatin (LIPITOR) 10 MG tablet    Sig: Take 10 mg by mouth daily.    Labs or Scans Ordered: No orders of the defined types were placed in this encounter.   Management:: Continue vivelle dot 0.1 + provera 2.5 daily Keep mammogram appointment, recommend 3D  Follow up Return in about 9 months (around 06/06/2017) for yearly, with Dr Elonda Husky.        Face to face time:  15 minutes  Greater than 50% of the visit time was spent in counseling and coordination of care with the patient.  The summary and outline of the counseling and care coordination is summarized in the note above.   All questions were answered.  Past Medical History:  Diagnosis Date  . Diabetes mellitus without complication ([place])   . SVT (supraventricular tachycardia) ([facility])     Past Surgical History:  Procedure Laterality Date  . CESAREAN SECTION  1986  . CHOLECYSTECTOMY    . COLONOSCOPY N/A 05/02/2015   Procedure: COLONOSCOPY;  Surgeon: Rogene Houston, MD;  [facility];  Service: Endoscopy;  Laterality: N/A;  74    OB History    Gravida Para  Term Preterm AB Living   2 1 1   1 1    SAB TAB Ectopic Multiple Live Births   1              Allergies  Allergen Reactions  . Daypro [Oxaprozin]     hives    Social History   Social History  . Marital status: Married    Spouse name: N/A  . Number of children: N/A  . Years of education: N/A   Social History Main Topics  . Smoking status: Never Smoker  . Smokeless tobacco: Never Used  . Alcohol use No  . Drug use: No  . Sexual activity: Yes    Birth control/ protection: Surgical     Comment: vasectomy   Other Topics Concern  . None   Social History Narrative  . None    Family History  Problem Relation Age of Onset  . Cancer Mother   . Heart failure Father

## 2016-12-01 ENCOUNTER — Other Ambulatory Visit: Payer: Self-pay | Admitting: Obstetrics & Gynecology

## 2016-12-01 DIAGNOSIS — Z1231 Encounter for screening mammogram for malignant neoplasm of breast: Secondary | ICD-10-CM

## 2016-12-10 ENCOUNTER — Ambulatory Visit (HOSPITAL_COMMUNITY)
Admission: RE | Admit: 2016-12-10 | Discharge: 2016-12-10 | Disposition: A | Discharge status: Home / Self Care | Payer: BLUE CROSS/BLUE SHIELD | Source: Ambulatory Visit | Attending: Obstetrics & Gynecology | Admitting: Obstetrics & Gynecology

## 2016-12-10 DIAGNOSIS — Z1231 Encounter for screening mammogram for malignant neoplasm of breast: Secondary | ICD-10-CM | POA: Insufficient documentation

## 2017-01-19 DIAGNOSIS — E119 Type 2 diabetes mellitus without complications: Secondary | ICD-10-CM | POA: Diagnosis not present

## 2017-01-19 DIAGNOSIS — I10 Essential (primary) hypertension: Secondary | ICD-10-CM | POA: Diagnosis not present

## 2017-01-19 DIAGNOSIS — Z1159 Encounter for screening for other viral diseases: Secondary | ICD-10-CM | POA: Diagnosis not present

## 2017-01-22 DIAGNOSIS — E119 Type 2 diabetes mellitus without complications: Secondary | ICD-10-CM | POA: Diagnosis not present

## 2017-01-22 DIAGNOSIS — F419 Anxiety disorder, unspecified: Secondary | ICD-10-CM | POA: Diagnosis not present

## 2017-01-22 DIAGNOSIS — F330 Major depressive disorder, recurrent, mild: Secondary | ICD-10-CM | POA: Diagnosis not present

## 2017-01-22 DIAGNOSIS — I10 Essential (primary) hypertension: Secondary | ICD-10-CM | POA: Diagnosis not present

## 2017-03-04 DIAGNOSIS — Z6830 Body mass index (BMI) 30.0-30.9, adult: Secondary | ICD-10-CM | POA: Diagnosis not present

## 2017-03-04 DIAGNOSIS — J209 Acute bronchitis, unspecified: Secondary | ICD-10-CM | POA: Diagnosis not present

## 2017-03-04 DIAGNOSIS — J019 Acute sinusitis, unspecified: Secondary | ICD-10-CM | POA: Diagnosis not present

## 2017-03-09 DIAGNOSIS — J069 Acute upper respiratory infection, unspecified: Secondary | ICD-10-CM | POA: Diagnosis not present

## 2017-06-17 ENCOUNTER — Other Ambulatory Visit: Payer: Self-pay | Admitting: Obstetrics & Gynecology

## 2017-06-19 ENCOUNTER — Other Ambulatory Visit: Payer: Self-pay | Admitting: Obstetrics & Gynecology

## 2017-07-09 DIAGNOSIS — N390 Urinary tract infection, site not specified: Secondary | ICD-10-CM | POA: Diagnosis not present

## 2017-07-22 ENCOUNTER — Encounter: Payer: Self-pay | Admitting: Adult Health

## 2017-07-22 ENCOUNTER — Ambulatory Visit (INDEPENDENT_AMBULATORY_CARE_PROVIDER_SITE_OTHER): Payer: BLUE CROSS/BLUE SHIELD | Admitting: Adult Health

## 2017-07-22 VITALS — BP 140/90 | HR 100 | Ht 69.5 in | Wt 214.0 lb

## 2017-07-22 DIAGNOSIS — N644 Mastodynia: Secondary | ICD-10-CM | POA: Diagnosis not present

## 2017-07-22 DIAGNOSIS — M898X1 Other specified disorders of bone, shoulder: Secondary | ICD-10-CM

## 2017-07-22 DIAGNOSIS — R319 Hematuria, unspecified: Secondary | ICD-10-CM | POA: Diagnosis not present

## 2017-07-22 LAB — POCT URINALYSIS DIPSTICK
Ketones, UA: NEGATIVE
LEUKOCYTES UA: NEGATIVE
NITRITE UA: NEGATIVE
PROTEIN UA: NEGATIVE

## 2017-07-22 MED ORDER — CYCLOBENZAPRINE HCL 10 MG PO TABS: 10.0000 mg | ORAL_TABLET | Freq: Three times a day (TID) | ORAL | 1 refills | Status: DC | PRN

## 2017-07-22 NOTE — Progress Notes (Signed)
Subjective:     Patient ID: Julia Wang, female   DOB: 10/19/1960, 57 y.o.   MRN: [id number]  HPI Julia Wang is a 57 year old white female in complaining of pain in left breast and underarm radiating to back and neck for about 2 weeks and hurts to drive.Has had tenderness and pain in breast before and has had cysts, had normal mammogram in June 2018. Had UTI recently and wants urine checked.   Review of Systems Pain in left breast and underarm that is radiating to back and neck for 2 weeks Hx breast tenderness and pain (had normal mammogram 11/2016)   Reviewed past medical,surgical, social and family history. Reviewed medications and allergies.  Objective:   Physical Exam BP 140/90 (BP Location: Left Arm, Patient Position: Sitting, Cuff Size: Large)   Pulse 100   Ht 5' 9.5" (1.765 m)   Wt 214 lb (97.1 kg)   LMP 06/15/2017   BMI 31.15 kg/m Urine dipstick 1+blood, trace glucose.  Skin warm and dry,  Breasts:no dominate palpable mass, retraction or nipple discharge, has tender thickness area left breast at 3 0'clock, no masses left under arm. +point tenderness left scapula, no pain with rasing left arm with good ROM or pain when moving neck, but when drives hurts.  Will try muscle relaxant and ice and F/U in 8 days, as could be muscle spasm and nerve compression.    Face time 15 minutes with 50 % counseling.   Assessment:     1. Breast tenderness in female   2. Pain in scapula   3. Hematuria, unspecified type       Plan:     Meds ordered this encounter  Medications  . cyclobenzaprine (FLEXERIL) 10 MG tablet    Sig: Take 1 tablet (10 mg total) by mouth every 8 (eight) hours as needed for muscle spasms.    Dispense:  30 tablet    Refill:  1    Order Specific Question:   Supervising Provider    Answer:   Tania Ade H [[id number]]  Use ice over tender area left scapula several times daily UA C&S sent Follow up in 8 days,if still with pain will get thoracic xray and diagnostic left breast  mammogram and [place]

## 2017-07-23 LAB — URINALYSIS, ROUTINE W REFLEX MICROSCOPIC
Bilirubin, UA: NEGATIVE
KETONES UA: NEGATIVE
LEUKOCYTES UA: NEGATIVE
Nitrite, UA: NEGATIVE
Protein, UA: NEGATIVE
SPEC GRAV UA: 1.022 (ref 1.005–1.030)
Urobilinogen, Ur: 0.2 mg/dL (ref 0.2–1.0)
pH, UA: 5 (ref 5.0–7.5)

## 2017-07-23 LAB — MICROSCOPIC EXAMINATION
CASTS: NONE SEEN /LPF
Epithelial Cells (non renal): >10 /hpf — AB (ref 0–10)

## 2017-07-24 LAB — URINE CULTURE

## 2017-07-26 DIAGNOSIS — E782 Mixed hyperlipidemia: Secondary | ICD-10-CM | POA: Diagnosis not present

## 2017-07-26 DIAGNOSIS — E119 Type 2 diabetes mellitus without complications: Secondary | ICD-10-CM | POA: Diagnosis not present

## 2017-07-26 DIAGNOSIS — I10 Essential (primary) hypertension: Secondary | ICD-10-CM | POA: Diagnosis not present

## 2017-07-26 DIAGNOSIS — N390 Urinary tract infection, site not specified: Secondary | ICD-10-CM | POA: Diagnosis not present

## 2017-07-26 DIAGNOSIS — D509 Iron deficiency anemia, unspecified: Secondary | ICD-10-CM | POA: Diagnosis not present

## 2017-07-30 ENCOUNTER — Encounter: Payer: Self-pay | Admitting: Adult Health

## 2017-07-30 ENCOUNTER — Ambulatory Visit (INDEPENDENT_AMBULATORY_CARE_PROVIDER_SITE_OTHER): Payer: BLUE CROSS/BLUE SHIELD | Admitting: Adult Health

## 2017-07-30 VITALS — BP 122/78 | HR 82 | Ht 69.5 in | Wt 213.0 lb

## 2017-07-30 DIAGNOSIS — N644 Mastodynia: Secondary | ICD-10-CM

## 2017-07-30 DIAGNOSIS — M898X1 Other specified disorders of bone, shoulder: Secondary | ICD-10-CM

## 2017-07-30 MED ORDER — NAPROXEN 500 MG PO TBEC: 500.0000 mg | DELAYED_RELEASE_TABLET | Freq: Two times a day (BID) | ORAL | 1 refills | Status: DC

## 2017-07-30 NOTE — Progress Notes (Signed)
Subjective:     Patient ID: Julia Wang, female   DOB: 1960/10/18, 57 y.o.   MRN: [id number]  HPI [name] is a 57 year old white female, back in follow up of left breast tenderness and pain in left scapula.  Review of Systems Breast tenderness resolved, since started period Still some pain in left scapula but better.    Objective:   Physical Exam BP 122/78 (BP Location: Left Arm, Patient Position: Sitting, Cuff Size: Large)   Pulse 82   Ht 5' 9.5" (1.765 m)   Wt 213 lb (96.6 kg)   LMP 07/29/2017   BMI 31.00 kg/m   Skin warm and dry.Still has some point tenderness left scapula but better,has good ROM. Deferred breast exam, since better.     Assessment:     1. Pain in scapula       Plan:     Continue flexeril  and ice and will add EC naprosyn. Meds ordered this encounter  Medications  . naproxen (EC NAPROSYN) 500 MG EC tablet    Sig: Take 1 tablet (500 mg total) by mouth 2 (two) times daily with a meal.    Dispense:  60 tablet    Refill:  1    Order Specific Question:   Supervising Provider    Answer:   Florian Buff [[id number]]  Call me in about 10 days for up date, may refer to orthopedic for evaluation

## 2017-08-04 DIAGNOSIS — Z0001 Encounter for general adult medical examination with abnormal findings: Secondary | ICD-10-CM | POA: Diagnosis not present

## 2017-08-17 ENCOUNTER — Other Ambulatory Visit: Payer: BLUE CROSS/BLUE SHIELD | Admitting: Obstetrics & Gynecology

## 2017-09-01 DIAGNOSIS — Z6831 Body mass index (BMI) 31.0-31.9, adult: Secondary | ICD-10-CM | POA: Diagnosis not present

## 2017-09-01 DIAGNOSIS — R7301 Impaired fasting glucose: Secondary | ICD-10-CM | POA: Diagnosis not present

## 2017-09-01 DIAGNOSIS — Z0001 Encounter for general adult medical examination with abnormal findings: Secondary | ICD-10-CM | POA: Diagnosis not present

## 2017-09-01 DIAGNOSIS — N390 Urinary tract infection, site not specified: Secondary | ICD-10-CM | POA: Diagnosis not present

## 2017-09-01 DIAGNOSIS — R300 Dysuria: Secondary | ICD-10-CM | POA: Diagnosis not present

## 2017-09-01 DIAGNOSIS — F410 Panic disorder [episodic paroxysmal anxiety] without agoraphobia: Secondary | ICD-10-CM | POA: Diagnosis not present

## 2017-09-01 DIAGNOSIS — E119 Type 2 diabetes mellitus without complications: Secondary | ICD-10-CM | POA: Diagnosis not present

## 2017-09-01 DIAGNOSIS — F419 Anxiety disorder, unspecified: Secondary | ICD-10-CM | POA: Diagnosis not present

## 2017-09-07 ENCOUNTER — Encounter: Payer: Self-pay | Admitting: Obstetrics & Gynecology

## 2017-09-07 ENCOUNTER — Other Ambulatory Visit (HOSPITAL_COMMUNITY)
Admission: RE | Admit: 2017-09-07 | Discharge: 2017-09-07 | Disposition: A | Discharge status: Home / Self Care | Payer: BLUE CROSS/BLUE SHIELD | Source: Ambulatory Visit | Attending: Obstetrics & Gynecology | Admitting: Obstetrics & Gynecology

## 2017-09-07 ENCOUNTER — Ambulatory Visit (INDEPENDENT_AMBULATORY_CARE_PROVIDER_SITE_OTHER): Payer: BLUE CROSS/BLUE SHIELD | Admitting: Obstetrics & Gynecology

## 2017-09-07 VITALS — BP 132/80 | HR 96 | Ht 69.5 in | Wt 212.5 lb

## 2017-09-07 DIAGNOSIS — Z1212 Encounter for screening for malignant neoplasm of rectum: Secondary | ICD-10-CM

## 2017-09-07 DIAGNOSIS — Z01419 Encounter for gynecological examination (general) (routine) without abnormal findings: Secondary | ICD-10-CM | POA: Insufficient documentation

## 2017-09-07 DIAGNOSIS — Z1211 Encounter for screening for malignant neoplasm of colon: Secondary | ICD-10-CM

## 2017-09-07 MED ORDER — ESTRADIOL 0.1 MG/24HR TD PTTW: MEDICATED_PATCH | TRANSDERMAL | 12 refills | Status: DC

## 2017-09-07 MED ORDER — MEDROXYPROGESTERONE ACETATE 2.5 MG PO TABS: 2.5000 mg | ORAL_TABLET | Freq: Every day | ORAL | 11 refills | Status: DC

## 2017-09-07 NOTE — Progress Notes (Signed)
Subjective:     Julia Wang is a 57 y.o. female here for a routine exam.  Patient's last menstrual period was 08/14/2017. [id number] Birth Control Method:  menopausal Menstrual Calendar(currently): amenorrheic  Current complaints: none.   Current acute medical issues:  none   Recent Gynecologic History Patient's last menstrual period was 08/14/2017. Last Pap: 2017,  normal Last mammogram: 6/18,  normal  Past Medical History:  Diagnosis Date  . Diabetes mellitus without complication ([place])   . SVT (supraventricular tachycardia) ([facility])     Past Surgical History:  Procedure Laterality Date  . CESAREAN SECTION  1986  . CHOLECYSTECTOMY    . COLONOSCOPY N/A 05/02/2015   Procedure: COLONOSCOPY;  Surgeon: Rogene Houston, MD;  [facility];  Service: Endoscopy;  Laterality: N/A;  30    OB History    Gravida  2   Para  1   Term  1   Preterm      AB  1   Living  1     SAB  1   TAB      Ectopic      Multiple      Live Births  1           Social History   Socioeconomic History  . Marital status: Married    Spouse name: Not on file  . Number of children: Not on file  . Years of education: Not on file  . Highest education level: Not on file  Occupational History  . Not on file  Social Needs  . Financial resource strain: Not on file  . Food insecurity:    Worry: Not on file    Inability: Not on file  . Transportation needs:    Medical: Not on file    Non-medical: Not on file  Tobacco Use  . Smoking status: Never Smoker  . Smokeless tobacco: Never Used  Substance and Sexual Activity  . Alcohol use: No  . Drug use: No  . Sexual activity: Yes    Birth control/protection: Surgical    Comment: vasectomy  Lifestyle  . Physical activity:    Days per week: Not on file    Minutes per session: Not on file  . Stress: Not on file  Relationships  . Social connections:    Talks on phone: Not on file    Gets together: Not on file    Attends  religious service: Not on file    Active member of club or organization: Not on file    Attends meetings of clubs or organizations: Not on file    Relationship status: Not on file  Other Topics Concern  . Not on file  Social History Narrative  . Not on file    Family History  Problem Relation Age of Onset  . Cancer Mother   . Heart failure Father   . Breast cancer Sister   . Cancer Brother        lung  . Alcoholism Brother      Current Outpatient Medications:  .  ALPRAZolam (XANAX) 0.5 MG tablet, Take 0.5 mg by mouth at bedtime as needed for anxiety., Disp: , Rfl:  .  aspirin EC 81 MG tablet, Take 81 mg by mouth daily., Disp: , Rfl:  .  atorvastatin (LIPITOR) 10 MG tablet, Take 10 mg by mouth daily., Disp: , Rfl:  .  Biotin w/ Vitamins C & E (HAIR/SKIN/NAILS PO), Take by mouth daily., Disp: , Rfl:  .  CINNAMON PO, Take 1 tablet by mouth 2 (two) times daily., Disp: , Rfl:  .  CRANBERRY PO, Take by mouth every morning., Disp: , Rfl:  .  estradiol (VIVELLE-DOT) 0.1 MG/24HR patch, apply 1 patch two times a week, Disp: 8 patch, Rfl: 12 .  medroxyPROGESTERone (PROVERA) 2.5 MG tablet, Take 1 tablet (2.5 mg total) by mouth daily., Disp: 30 tablet, Rfl: 11 .  metFORMIN (GLUCOPHAGE) 500 MG tablet, TK 1 T PO BID, Disp: , Rfl: 1 .  sertraline (ZOLOFT) 50 MG tablet, Take 75 mg by mouth daily. , Disp: , Rfl:   Review of Systems  Review of Systems  Constitutional: Negative for fever, chills, weight loss, malaise/fatigue and diaphoresis.  HENT: Negative for hearing loss, ear pain, nosebleeds, congestion, sore throat, neck pain, tinnitus and ear discharge.   Eyes: Negative for blurred vision, double vision, photophobia, pain, discharge and redness.  Respiratory: Negative for cough, hemoptysis, sputum production, shortness of breath, wheezing and stridor.   Cardiovascular: Negative for chest pain, palpitations, orthopnea, claudication, leg swelling and PND.  Gastrointestinal: negative for  abdominal pain. Negative for heartburn, nausea, vomiting, diarrhea, constipation, blood in stool and melena.  Genitourinary: Negative for dysuria, urgency, frequency, hematuria and flank pain.  Musculoskeletal: Negative for myalgias, back pain, joint pain and falls.  Skin: Negative for itching and rash.  Neurological: Negative for dizziness, tingling, tremors, sensory change, speech change, focal weakness, seizures, loss of consciousness, weakness and headaches.  Endo/Heme/Allergies: Negative for environmental allergies and polydipsia. Does not bruise/bleed easily.  Psychiatric/Behavioral: Negative for depression, suicidal ideas, hallucinations, memory loss and substance abuse. The patient is not nervous/anxious and does not have insomnia.        Objective:  Blood pressure 132/80, pulse 96, height 5' 9.5" (1.765 m), weight 212 lb 8 oz (96.4 kg), last menstrual period 08/14/2017.   Physical Exam  Vitals reviewed. Constitutional: She is oriented to person, place, and time. She appears well-developed and well-nourished.  HENT:  Head: Normocephalic and atraumatic.        Right Ear: External ear normal.  Left Ear: External ear normal.  Nose: Nose normal.  Mouth/Throat: Oropharynx is clear and moist.  Eyes: Conjunctivae and EOM are normal. Pupils are equal, round, and reactive to light. Right eye exhibits no discharge. Left eye exhibits no discharge. No scleral icterus.  Neck: Normal range of motion. Neck supple. No tracheal deviation present. No thyromegaly present.  Cardiovascular: Normal rate, regular rhythm, normal heart sounds and intact distal pulses.  Exam reveals no gallop and no friction rub.   No murmur heard. Respiratory: Effort normal and breath sounds normal. No respiratory distress. She has no wheezes. She has no rales. She exhibits no tenderness.  GI: Soft. Bowel sounds are normal. She exhibits no distension and no mass. There is no tenderness. There is no rebound and no guarding.   Genitourinary:  Breasts no masses skin changes or nipple changes bilaterally      Vulva is normal without lesions Vagina is pink moist without discharge Cervix normal in appearance and pap is done Uterus is normal size shape and contour Adnexa is negative with normal sized ovaries  {Rectal    hemoccult negative, normal tone, no masses  Musculoskeletal: Normal range of motion. She exhibits no edema and no tenderness.  Neurological: She is alert and oriented to person, place, and time. She has normal reflexes. She displays normal reflexes. No cranial nerve deficit. She exhibits normal muscle tone. Coordination normal.  Skin: Skin is warm and  dry. No rash noted. No erythema. No pallor.  Psychiatric: She has a normal mood and affect. Her behavior is normal. Judgment and thought content normal.       Medications Ordered at today's visit: Meds ordered this encounter  Medications  . medroxyPROGESTERone (PROVERA) 2.5 MG tablet    Sig: Take 1 tablet (2.5 mg total) by mouth daily.    Dispense:  30 tablet    Refill:  11  . estradiol (VIVELLE-DOT) 0.1 MG/24HR patch    Sig: apply 1 patch two times a week    Dispense:  8 patch    Refill:  12    Other orders placed at today's visit: No orders of the defined types were placed in this encounter.     Assessment:    Healthy female exam.    Plan:    Mammogram ordered. Follow up in: 2 years.     Return in about 2 years (around 09/08/2019) for yearly, with Dr Elonda Husky.

## 2017-09-13 LAB — CYTOLOGY - PAP
Adequacy: ABSENT
DIAGNOSIS: NEGATIVE
HPV (WINDOPATH): NOT DETECTED

## 2017-09-17 DIAGNOSIS — N939 Abnormal uterine and vaginal bleeding, unspecified: Secondary | ICD-10-CM | POA: Diagnosis not present

## 2017-09-17 DIAGNOSIS — R300 Dysuria: Secondary | ICD-10-CM | POA: Diagnosis not present

## 2017-11-09 IMAGING — DX DG CHEST 2V
2 series · 2 of 2 positions shown · non-contrast
Comparison: Chest x-ray 05/12/2004.

CLINICAL DATA: 55-year-old female with history of tachycardia. No
associated chest pain or shortness of breath.

EXAM:
CHEST  2 VIEW

[chest pa]
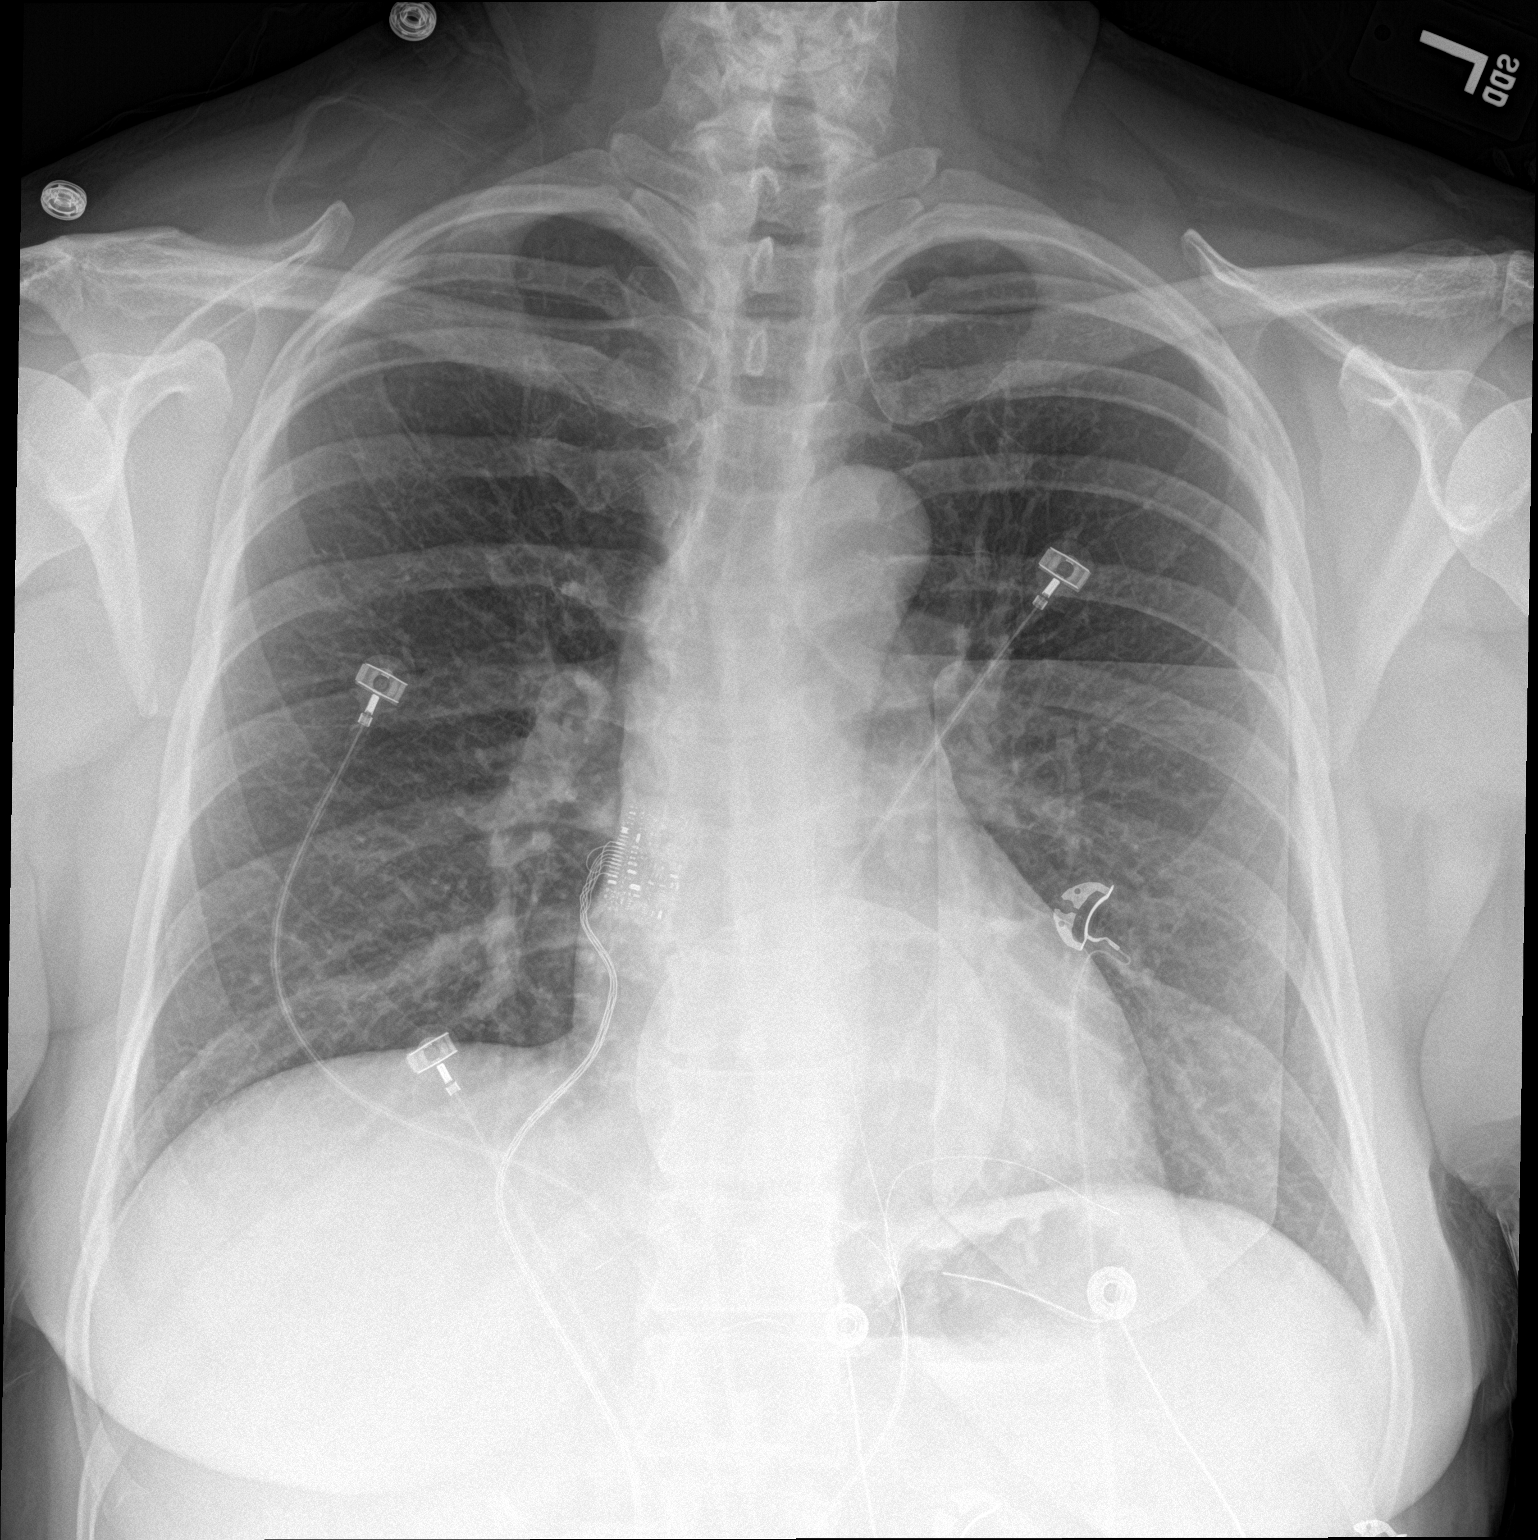

[chest lat]
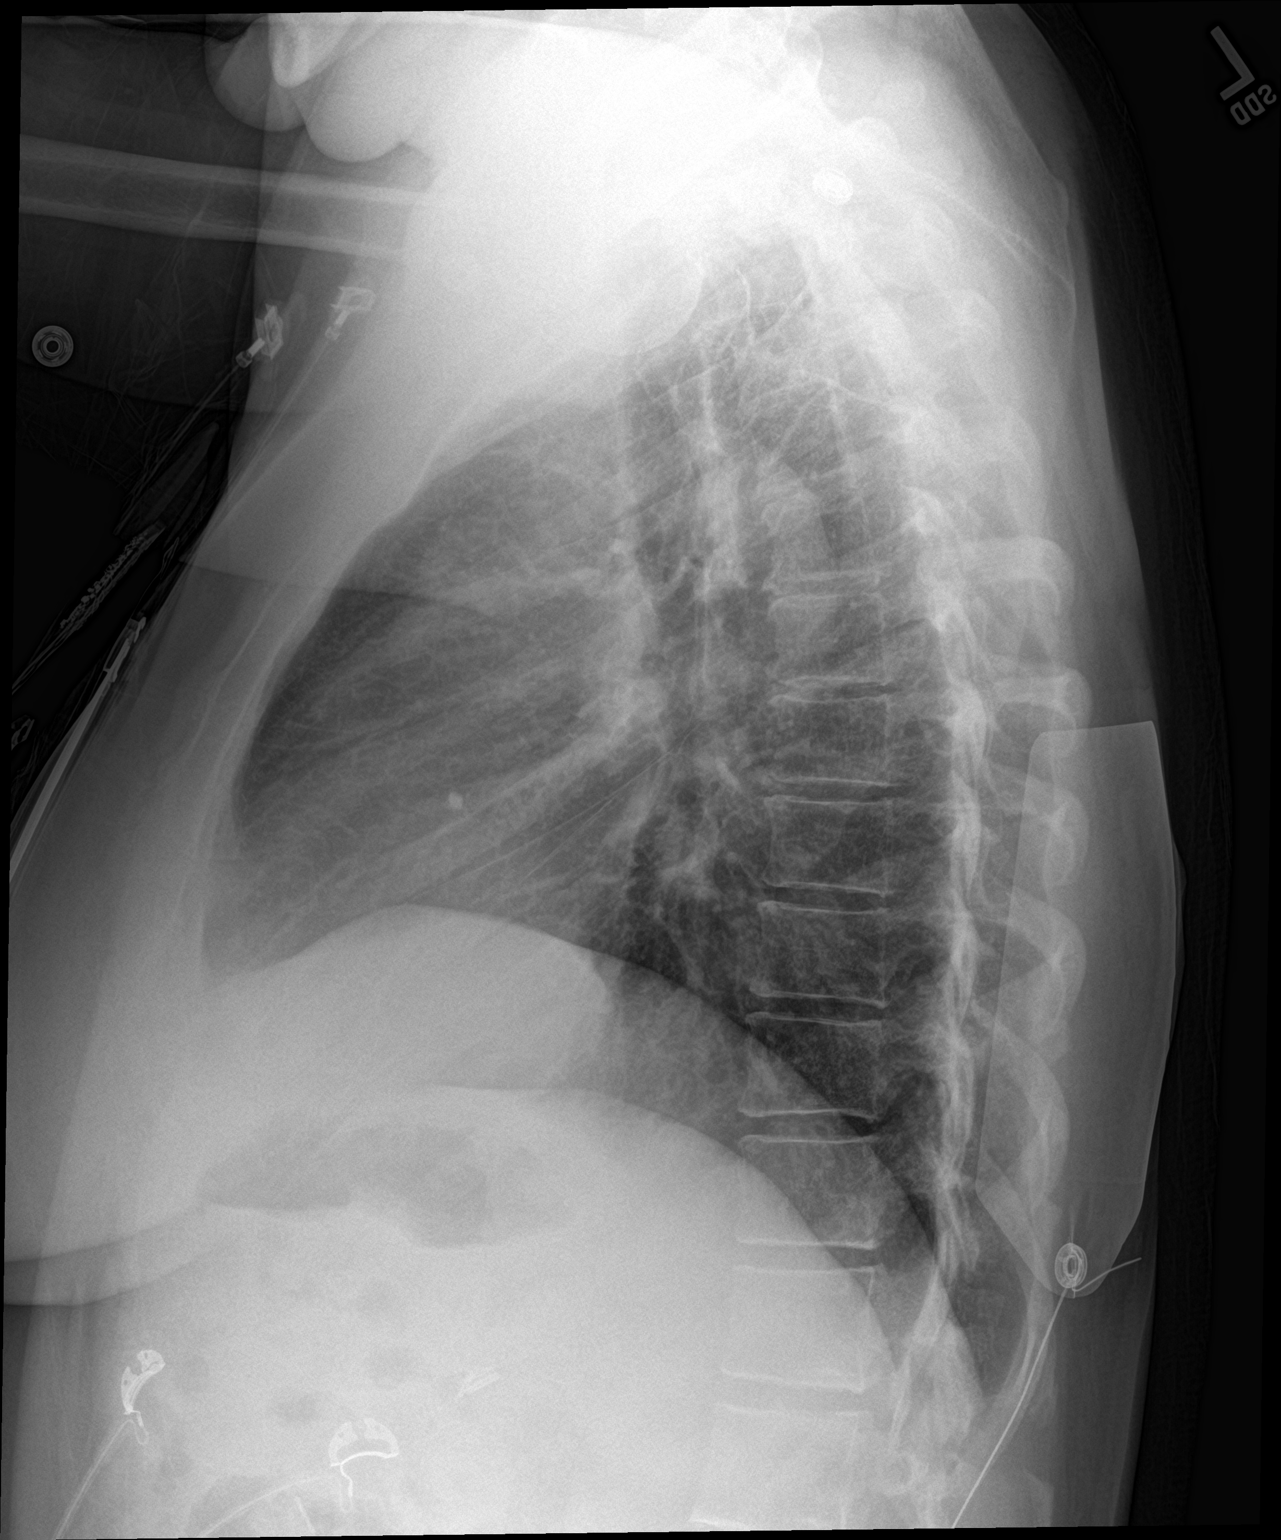

[2 of 2 positions shown; findings below may reference images not displayed]

FINDINGS: Lung volumes are normal. No consolidative airspace disease. No
pleural effusions. No pneumothorax. No pulmonary nodule or mass
noted. Pulmonary vasculature and the cardiomediastinal silhouette
are within normal limits. Transcutaneous defibrillator pads are
noted overlying the left hemithorax.
IMPRESSION: 1. No radiographic evidence of acute cardiopulmonary disease.

## 2017-12-06 DIAGNOSIS — Z6830 Body mass index (BMI) 30.0-30.9, adult: Secondary | ICD-10-CM | POA: Diagnosis not present

## 2017-12-06 DIAGNOSIS — N390 Urinary tract infection, site not specified: Secondary | ICD-10-CM | POA: Diagnosis not present

## 2018-01-03 ENCOUNTER — Other Ambulatory Visit: Payer: Self-pay | Admitting: Obstetrics & Gynecology

## 2018-01-03 DIAGNOSIS — Z1231 Encounter for screening mammogram for malignant neoplasm of breast: Secondary | ICD-10-CM

## 2018-01-07 ENCOUNTER — Ambulatory Visit (HOSPITAL_COMMUNITY)
Admission: RE | Admit: 2018-01-07 | Discharge: 2018-01-07 | Disposition: A | Discharge status: Home / Self Care | Payer: BLUE CROSS/BLUE SHIELD | Source: Ambulatory Visit | Attending: Obstetrics & Gynecology | Admitting: Obstetrics & Gynecology

## 2018-01-07 DIAGNOSIS — Z1231 Encounter for screening mammogram for malignant neoplasm of breast: Secondary | ICD-10-CM | POA: Diagnosis not present

## 2018-01-10 ENCOUNTER — Other Ambulatory Visit: Payer: Self-pay | Admitting: Obstetrics & Gynecology

## 2018-01-10 DIAGNOSIS — R928 Other abnormal and inconclusive findings on diagnostic imaging of breast: Secondary | ICD-10-CM

## 2018-01-18 ENCOUNTER — Ambulatory Visit (HOSPITAL_COMMUNITY)
Admission: RE | Admit: 2018-01-18 | Discharge: 2018-01-18 | Disposition: A | Discharge status: Home / Self Care | Payer: BLUE CROSS/BLUE SHIELD | Source: Ambulatory Visit | Attending: Obstetrics & Gynecology | Admitting: Obstetrics & Gynecology

## 2018-01-18 ENCOUNTER — Other Ambulatory Visit: Payer: Self-pay | Admitting: Obstetrics & Gynecology

## 2018-01-18 DIAGNOSIS — R928 Other abnormal and inconclusive findings on diagnostic imaging of breast: Secondary | ICD-10-CM

## 2018-01-18 DIAGNOSIS — N6320 Unspecified lump in the left breast, unspecified quadrant: Secondary | ICD-10-CM | POA: Diagnosis not present

## 2018-01-25 ENCOUNTER — Other Ambulatory Visit (HOSPITAL_COMMUNITY): Payer: Self-pay | Admitting: Internal Medicine

## 2018-01-25 ENCOUNTER — Ambulatory Visit (HOSPITAL_COMMUNITY)
Admission: RE | Admit: 2018-01-25 | Discharge: 2018-01-25 | Disposition: A | Discharge status: Home / Self Care | Payer: BLUE CROSS/BLUE SHIELD | Source: Ambulatory Visit | Attending: Obstetrics & Gynecology | Admitting: Obstetrics & Gynecology

## 2018-01-25 ENCOUNTER — Ambulatory Visit (HOSPITAL_COMMUNITY)
Admission: RE | Admit: 2018-01-25 | Discharge: 2018-01-25 | Disposition: A | Discharge status: Home / Self Care | Payer: BLUE CROSS/BLUE SHIELD | Source: Ambulatory Visit | Attending: Internal Medicine | Admitting: Internal Medicine

## 2018-01-25 DIAGNOSIS — R928 Other abnormal and inconclusive findings on diagnostic imaging of breast: Secondary | ICD-10-CM

## 2018-01-25 DIAGNOSIS — N6321 Unspecified lump in the left breast, upper outer quadrant: Secondary | ICD-10-CM | POA: Diagnosis not present

## 2018-01-25 DIAGNOSIS — N6012 Diffuse cystic mastopathy of left breast: Secondary | ICD-10-CM | POA: Diagnosis not present

## 2018-01-25 MED ORDER — LIDOCAINE-EPINEPHRINE (PF) 1 %-1:200000 IJ SOLN
INTRAMUSCULAR | Status: AC
  Administered 2018-01-25: 30 mL
  Filled 2018-01-25: qty 30

## 2018-02-01 DIAGNOSIS — E785 Hyperlipidemia, unspecified: Secondary | ICD-10-CM | POA: Diagnosis not present

## 2018-02-01 DIAGNOSIS — E119 Type 2 diabetes mellitus without complications: Secondary | ICD-10-CM | POA: Diagnosis not present

## 2018-02-03 DIAGNOSIS — E782 Mixed hyperlipidemia: Secondary | ICD-10-CM | POA: Diagnosis not present

## 2018-02-03 DIAGNOSIS — F410 Panic disorder [episodic paroxysmal anxiety] without agoraphobia: Secondary | ICD-10-CM | POA: Diagnosis not present

## 2018-02-03 DIAGNOSIS — E1169 Type 2 diabetes mellitus with other specified complication: Secondary | ICD-10-CM | POA: Diagnosis not present

## 2018-08-02 ENCOUNTER — Other Ambulatory Visit: Payer: Self-pay | Admitting: Obstetrics & Gynecology

## 2018-08-09 DIAGNOSIS — E782 Mixed hyperlipidemia: Secondary | ICD-10-CM | POA: Diagnosis not present

## 2018-08-09 DIAGNOSIS — E785 Hyperlipidemia, unspecified: Secondary | ICD-10-CM | POA: Diagnosis not present

## 2018-08-09 DIAGNOSIS — E119 Type 2 diabetes mellitus without complications: Secondary | ICD-10-CM | POA: Diagnosis not present

## 2018-08-09 DIAGNOSIS — E1169 Type 2 diabetes mellitus with other specified complication: Secondary | ICD-10-CM | POA: Diagnosis not present

## 2018-08-15 DIAGNOSIS — N390 Urinary tract infection, site not specified: Secondary | ICD-10-CM | POA: Diagnosis not present

## 2018-08-15 DIAGNOSIS — E785 Hyperlipidemia, unspecified: Secondary | ICD-10-CM | POA: Diagnosis not present

## 2018-08-15 DIAGNOSIS — E782 Mixed hyperlipidemia: Secondary | ICD-10-CM | POA: Diagnosis not present

## 2018-08-15 DIAGNOSIS — N182 Chronic kidney disease, stage 2 (mild): Secondary | ICD-10-CM | POA: Diagnosis not present

## 2018-08-15 DIAGNOSIS — E1169 Type 2 diabetes mellitus with other specified complication: Secondary | ICD-10-CM | POA: Diagnosis not present

## 2018-08-15 DIAGNOSIS — E119 Type 2 diabetes mellitus without complications: Secondary | ICD-10-CM | POA: Diagnosis not present

## 2018-08-15 DIAGNOSIS — R300 Dysuria: Secondary | ICD-10-CM | POA: Diagnosis not present

## 2018-12-13 DIAGNOSIS — E785 Hyperlipidemia, unspecified: Secondary | ICD-10-CM | POA: Diagnosis not present

## 2018-12-13 DIAGNOSIS — E1169 Type 2 diabetes mellitus with other specified complication: Secondary | ICD-10-CM | POA: Diagnosis not present

## 2018-12-13 DIAGNOSIS — E119 Type 2 diabetes mellitus without complications: Secondary | ICD-10-CM | POA: Diagnosis not present

## 2018-12-13 DIAGNOSIS — E782 Mixed hyperlipidemia: Secondary | ICD-10-CM | POA: Diagnosis not present

## 2018-12-20 DIAGNOSIS — E782 Mixed hyperlipidemia: Secondary | ICD-10-CM | POA: Diagnosis not present

## 2018-12-20 DIAGNOSIS — E1169 Type 2 diabetes mellitus with other specified complication: Secondary | ICD-10-CM | POA: Diagnosis not present

## 2018-12-20 DIAGNOSIS — Z0001 Encounter for general adult medical examination with abnormal findings: Secondary | ICD-10-CM | POA: Diagnosis not present

## 2018-12-20 DIAGNOSIS — I472 Ventricular tachycardia: Secondary | ICD-10-CM | POA: Diagnosis not present

## 2019-01-16 DIAGNOSIS — N390 Urinary tract infection, site not specified: Secondary | ICD-10-CM | POA: Diagnosis not present

## 2019-03-06 DIAGNOSIS — E119 Type 2 diabetes mellitus without complications: Secondary | ICD-10-CM | POA: Diagnosis not present

## 2019-03-06 DIAGNOSIS — F419 Anxiety disorder, unspecified: Secondary | ICD-10-CM | POA: Diagnosis not present

## 2019-03-06 DIAGNOSIS — R319 Hematuria, unspecified: Secondary | ICD-10-CM | POA: Diagnosis not present

## 2019-03-06 DIAGNOSIS — R002 Palpitations: Secondary | ICD-10-CM | POA: Diagnosis not present

## 2019-03-06 DIAGNOSIS — I471 Supraventricular tachycardia: Secondary | ICD-10-CM | POA: Diagnosis not present

## 2019-03-06 DIAGNOSIS — R300 Dysuria: Secondary | ICD-10-CM | POA: Diagnosis not present

## 2019-03-06 DIAGNOSIS — D45 Polycythemia vera: Secondary | ICD-10-CM | POA: Diagnosis not present

## 2019-03-06 DIAGNOSIS — Z136 Encounter for screening for cardiovascular disorders: Secondary | ICD-10-CM | POA: Diagnosis not present

## 2019-03-06 DIAGNOSIS — E782 Mixed hyperlipidemia: Secondary | ICD-10-CM | POA: Diagnosis not present

## 2019-03-06 DIAGNOSIS — E1169 Type 2 diabetes mellitus with other specified complication: Secondary | ICD-10-CM | POA: Diagnosis not present

## 2019-03-26 NOTE — Progress Notes (Signed)
CARDIOLOGY CONSULT NOTE       Patient ID: Julia Wang MRN: [id number] DOB/AGE: 12/13/1960 58 y.o.  Admit date: (Not on file) Referring Physician: Nevada Crane Primary Physician: Celene Squibb, MD Primary Cardiologist: New Reason for Consultation: SVT  Active Problems:   * No active hospital problems. *   HPI:  59 y.o. with DM and history of tachycardia. Seen in ER 05/16/16 with sudden onset of palpitations At that Time noted to be in SVT at rate of 164 narrow complex no pre excitation She broke with one dose of adenosine Post ECG normal CXR labs normal Associated with some anxiety. She has had yearly episodes and worse this  Past year with 4 prolonged episodes lasting as long as an hour She tries putting her hands in cold water and Takes Toprol and things usually calm down over an hour. She also gets frequent palpitations where she thinks Its going to start but only lasts minutes. No recent pre syncope chest pain or dyspnea. She is diabetic and trying  To lose weight   ROS All other systems reviewed and negative except as noted above  Past Medical History:  Diagnosis Date  . Diabetes mellitus without complication ([place])   . SVT (supraventricular tachycardia) (HCC)     Family History  Problem Relation Age of Onset  . Cancer Mother   . Heart failure Father   . Breast cancer Sister   . Cancer Brother        lung  . Alcoholism Brother     Social History   Socioeconomic History  . Marital status: Married    Spouse name: Not on file  . Number of children: Not on file  . Years of education: Not on file  . Highest education level: Not on file  Occupational History  . Not on file  Social Needs  . Financial resource strain: Not on file  . Food insecurity    Worry: Not on file    Inability: Not on file  . Transportation needs    Medical: Not on file    Non-medical: Not on file  Tobacco Use  . Smoking status: Never Smoker  . Smokeless tobacco: Never Used  Substance and  Sexual Activity  . Alcohol use: No  . Drug use: No  . Sexual activity: Yes    Birth control/protection: Surgical    Comment: vasectomy  Lifestyle  . Physical activity    Days per week: Not on file    Minutes per session: Not on file  . Stress: Not on file  Relationships  . Social Herbalist on phone: Not on file    Gets together: Not on file    Attends religious service: Not on file    Active member of club or organization: Not on file    Attends meetings of clubs or organizations: Not on file    Relationship status: Not on file  . Intimate partner violence    Fear of current or ex partner: Not on file    Emotionally abused: Not on file    Physically abused: Not on file    Forced sexual activity: Not on file  Other Topics Concern  . Not on file  Social History Narrative  . Not on file    Past Surgical History:  Procedure Laterality Date  . CESAREAN SECTION  1986  . CHOLECYSTECTOMY    . COLONOSCOPY N/A 05/02/2015   Procedure: COLONOSCOPY;  Surgeon: Rogene Houston, MD;  Location: [facility];  Service: Endoscopy;  Laterality: N/A;  1030        Physical Exam: Blood pressure 128/82, pulse 82, temperature 97.7 F (36.5 C), temperature source Temporal, height 5' 9.5" (1.765 m), weight 198 lb (89.8 kg).    Affect appropriate Healthy:  appears stated age 60: normal Neck supple with no adenopathy JVP normal no bruits no thyromegaly Lungs clear with no wheezing and good diaphragmatic motion Heart:  S1/S2 no murmur, no rub, gallop or click PMI normal Abdomen: benighn, BS positve, no tenderness, no AAA no bruit.  No HSM or HJR Distal pulses intact with no bruits No edema Neuro non-focal Skin warm and dry No muscular weakness   Labs:   Lab Results  Component Value Date   WBC 12.3 (H) 05/16/2016   HGB 17.6 (H) 05/16/2016   HCT 51.4 (H) 05/16/2016   MCV 88.9 05/16/2016   PLT 348 05/16/2016   No results for input(s): NA, K, CL, CO2, BUN,  CREATININE, CALCIUM, PROT, BILITOT, ALKPHOS, ALT, AST, GLUCOSE in the last 168 hours.  Invalid input(s): LABALBU No results found for: CKTOTAL, CKMB, CKMBINDEX, TROPONINI No results found for: CHOL No results found for: HDL No results found for: LDLCALC No results found for: TRIG No results found for: CHOLHDL No results found for: LDLDIRECT    Radiology: No results found.  EKG: 2017  SVT rate 164 narrow complex     ASSESSMENT AND PLAN:   1. DM:  Discussed low carb diet.  Target hemoglobin A1c is 6.5 or less.  Continue current medications. 2. SVT:  Continue PRN Toprol discussed other vagal maneuvers feel she should be considered for ablation Refer to EP   Signed: Jenkins Rouge 03/28/2019, 2:09 PM

## 2019-03-28 ENCOUNTER — Encounter: Payer: Self-pay | Admitting: Cardiovascular Disease

## 2019-03-28 ENCOUNTER — Other Ambulatory Visit: Payer: Self-pay

## 2019-03-28 ENCOUNTER — Ambulatory Visit: Payer: BLUE CROSS/BLUE SHIELD | Admitting: Cardiovascular Disease

## 2019-03-28 VITALS — BP 128/82 | HR 82 | Temp 97.7°F | Ht 69.5 in | Wt 198.0 lb

## 2019-03-28 DIAGNOSIS — I471 Supraventricular tachycardia: Secondary | ICD-10-CM

## 2019-03-28 NOTE — Patient Instructions (Addendum)
Medication Instructions:  Your physician recommends that you continue on your current medications as directed. Please refer to the Current Medication list given to you today.   Labwork: NONE  Testing/Procedures: Your physician has requested that you have an echocardiogram. Echocardiography is a painless test that uses sound waves to create images of your heart. It provides your doctor with information about the size and shape of your heart and how well your heart's chambers and valves are working. This procedure takes approximately one hour. There are no restrictions for this procedure.  Follow-Up: Your physician recommends that you schedule a follow-up appointment in: AS NEEDED    Any Other Special Instructions Will Be Listed Below (If Applicable).  You have been referred to CARDIAC EP (DR. Lovena Le)     If you need a refill on your cardiac medications before your next appointment, please call your pharmacy.

## 2019-04-07 ENCOUNTER — Other Ambulatory Visit: Payer: Self-pay

## 2019-04-07 ENCOUNTER — Ambulatory Visit (HOSPITAL_COMMUNITY)
Admission: RE | Admit: 2019-04-07 | Discharge: 2019-04-07 | Disposition: A | Discharge status: Home / Self Care | Payer: BC Managed Care – PPO | Source: Ambulatory Visit | Attending: Cardiovascular Disease | Admitting: Cardiovascular Disease

## 2019-04-07 DIAGNOSIS — I471 Supraventricular tachycardia: Secondary | ICD-10-CM | POA: Diagnosis not present

## 2019-04-07 NOTE — Progress Notes (Signed)
*  PRELIMINARY RESULTS* Echocardiogram 2D Echocardiogram has been performed.  Julia Wang 04/07/2019, 9:24 AM

## 2019-04-19 ENCOUNTER — Other Ambulatory Visit (HOSPITAL_COMMUNITY): Payer: Self-pay | Admitting: Obstetrics & Gynecology

## 2019-04-19 DIAGNOSIS — Z1231 Encounter for screening mammogram for malignant neoplasm of breast: Secondary | ICD-10-CM

## 2019-05-02 ENCOUNTER — Encounter: Payer: Self-pay | Admitting: Internal Medicine

## 2019-05-02 ENCOUNTER — Ambulatory Visit: Payer: BC Managed Care – PPO | Admitting: Internal Medicine

## 2019-05-02 ENCOUNTER — Other Ambulatory Visit: Payer: Self-pay

## 2019-05-02 VITALS — BP 131/83 | HR 81 | Temp 97.5°F | Wt 197.6 lb

## 2019-05-02 DIAGNOSIS — I471 Supraventricular tachycardia: Secondary | ICD-10-CM | POA: Diagnosis not present

## 2019-05-02 NOTE — Patient Instructions (Signed)
Medication Instructions:  Your physician recommends that you continue on your current medications as directed. Please refer to the Current Medication list given to you today.  *If you need a refill on your cardiac medications before your next appointment, please call your pharmacy*  Lab Work: NONE  If you have labs (blood work) drawn today and your tests are completely normal, you will receive your results only by: Marland Kitchen MyChart Message (if you have MyChart) OR . A paper copy in the mail If you have any lab test that is abnormal or we need to change your treatment, we will call you to review the results.  Testing/Procedures: Your physician has recommended that you have an ablation. Catheter ablation is a medical procedure used to treat some cardiac arrhythmias (irregular heartbeats). During catheter ablation, a long, thin, flexible tube is put into a blood vessel in your groin (upper thigh), or neck. This tube is called an ablation catheter. It is then guided to your heart through the blood vessel. Radio frequency waves destroy small areas of heart tissue where abnormal heartbeats may cause an arrhythmia to start. Please see the instruction sheet given to you today.  Available Dates: Jan: 6, 8, 11, 22, 27  Feb, 3, 5   Follow-Up: At [facility], you and your health needs are our priority.  As part of our continuing mission to provide you with exceptional heart care, we have created designated Provider Care Teams.  These Care Teams include your primary Cardiologist (physician) and Advanced Practice Providers (APPs -  Physician Assistants and Nurse Practitioners) who all work together to provide you with the care you need, when you need it.  Your next appointment:    As Needed   The format for your next appointment:   In Person  Provider:   Cristopher Peru, MD  Other Instructions Thank you for choosing [facility]!

## 2019-05-02 NOTE — Progress Notes (Signed)
HPI Julia Wang is referred today by Dr. Johnsie Cancel for evaluation of SVT. She is an otherwise healthy woman except for DM. She has had palpitations for several years but these have worsened since 2017. She has been placed on beta blocker therapy but continued to have break through SVT. These typically last up to an hour and she will feel fatigued after. When she goes into SVT she gets anxious but no syncope and no chest pain or sob.  Allergies  Allergen Reactions  . Daypro [Oxaprozin]     hives     Current Outpatient Medications  Medication Sig Dispense Refill  . ALPRAZolam (XANAX) 1 MG tablet TK 1 T PO TID PRF ANXIETY    . aspirin EC 81 MG tablet Take 81 mg by mouth daily.    Marland Kitchen atorvastatin (LIPITOR) 10 MG tablet Take 10 mg by mouth daily.    Marland Kitchen CRANBERRY PO Take by mouth every morning.    . metFORMIN (GLUCOPHAGE) 500 MG tablet TK 1 T PO BID  1  . metoprolol succinate (TOPROL-XL) 25 MG 24 hr tablet Take 25 mg by mouth daily.    . metoprolol tartrate (LOPRESSOR) 50 MG tablet Take 50 mg by mouth daily as needed.    . sertraline (ZOLOFT) 50 MG tablet Take 75 mg by mouth daily.      No current facility-administered medications for this visit.      Past Medical History:  Diagnosis Date  . Diabetes mellitus without complication ([place])   . SVT (supraventricular tachycardia) (HCC)     ROS:   All systems reviewed and negative except as noted in the HPI.   Past Surgical History:  Procedure Laterality Date  . CESAREAN SECTION  1986  . CHOLECYSTECTOMY    . COLONOSCOPY N/A 05/02/2015   Procedure: COLONOSCOPY;  Surgeon: Rogene Houston, MD;  Location[facility];  Service: Endoscopy;  Laterality: N/A;  1030     Family History  Problem Relation Age of Onset  . Cancer Mother   . Heart failure Father   . Breast cancer Sister   . Cancer Brother        lung  . Alcoholism Brother      Social History   Socioeconomic History  . Marital status: Married    Spouse name: Not  on file  . Number of children: Not on file  . Years of education: Not on file  . Highest education level: Not on file  Occupational History  . Not on file  Social Needs  . Financial resource strain: Not on file  . Food insecurity    Worry: Not on file    Inability: Not on file  . Transportation needs    Medical: Not on file    Non-medical: Not on file  Tobacco Use  . Smoking status: Never Smoker  . Smokeless tobacco: Never Used  Substance and Sexual Activity  . Alcohol use: No  . Drug use: No  . Sexual activity: Yes    Birth control/protection: Surgical    Comment: vasectomy  Lifestyle  . Physical activity    Days per week: Not on file    Minutes per session: Not on file  . Stress: Not on file  Relationships  . Social Herbalist on phone: Not on file    Gets together: Not on file    Attends religious service: Not on file    Active member of club or organization: Not  on file    Attends meetings of clubs or organizations: Not on file    Relationship status: Not on file  . Intimate partner violence    Fear of current or ex partner: Not on file    Emotionally abused: Not on file    Physically abused: Not on file    Forced sexual activity: Not on file  Other Topics Concern  . Not on file  Social History Narrative  . Not on file     BP 131/83   Pulse 81   Temp (!) 97.5 F (36.4 C)   Wt 197 lb 9.6 oz (89.6 kg)   SpO2 96%   BMI 28.76 kg/m   Physical Exam:  Well appearing NAD HEENT: Unremarkable Neck:  No JVD, no thyromegally Lymphatics:  No adenopathy Back:  No CVA tenderness Lungs:  Clear with no wheezes HEART:  Regular rate rhythm, no murmurs, no rubs, no clicks Abd:  soft, positive bowel sounds, no organomegally, no rebound, no guarding Ext:  2 plus pulses, no edema, no cyanosis, no clubbing Skin:  No rashes no nodules Neuro:  CN II through XII intact, motor grossly intact  EKG - reviewed. NSR with no pre-excitation. Short RP tachy    Assess/Plan: 1. SVT - I have discussed the treatment options with the patient and the risks/benefits/goals/expectations of EP study and catheter ablation were reviewed. She will call us if she wishes to proceed. 2. HTN - she will continue her metoprolol. After ablation, I would anticipate switching to a different bp medication.  Mikle Bosworth.D.

## 2019-05-15 ENCOUNTER — Other Ambulatory Visit: Payer: Self-pay

## 2019-05-15 ENCOUNTER — Ambulatory Visit (HOSPITAL_COMMUNITY)
Admission: RE | Admit: 2019-05-15 | Discharge: 2019-05-15 | Disposition: A | Discharge status: Home / Self Care | Payer: BC Managed Care – PPO | Source: Ambulatory Visit | Attending: Obstetrics & Gynecology | Admitting: Obstetrics & Gynecology

## 2019-05-15 DIAGNOSIS — Z1231 Encounter for screening mammogram for malignant neoplasm of breast: Secondary | ICD-10-CM | POA: Diagnosis not present

## 2019-07-14 IMAGING — MG DIGITAL DIAGNOSTIC UNILATERAL LEFT MAMMOGRAM WITH TOMO AND CAD
7 series · 8 of 23 positions shown · non-contrast
Comparison: Previous exam(s).

CLINICAL DATA: 57-year-old female recalled from screening mammogram
dated 01/07/2018 for a possible left breast mass.

EXAM:
DIGITAL DIAGNOSTIC LEFT MAMMOGRAM WITH CAD AND TOMO
ULTRASOUND LEFT BREAST

[L XCCL]
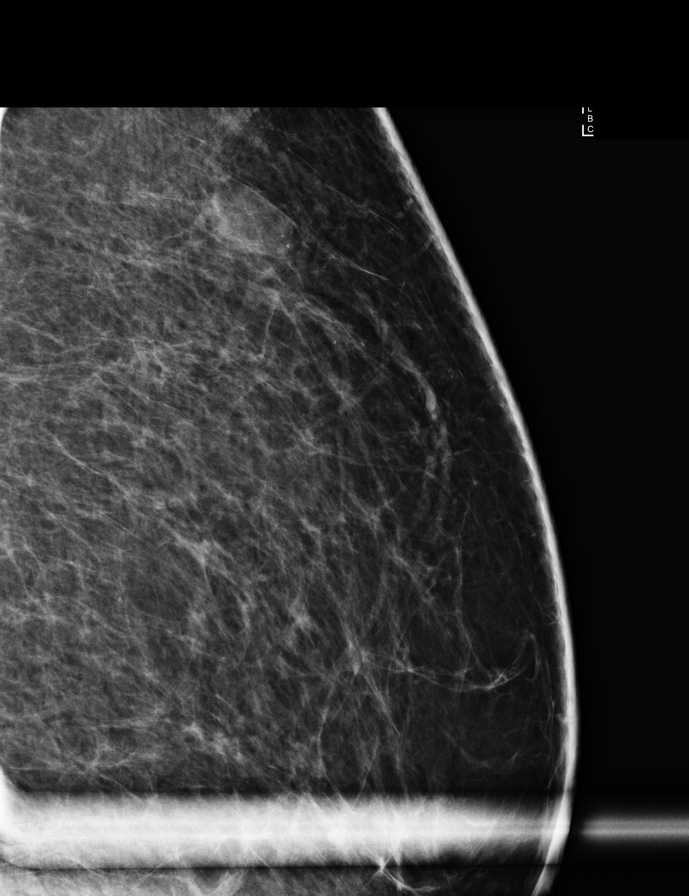

[L XCCL synth-2D (1 of 2)]
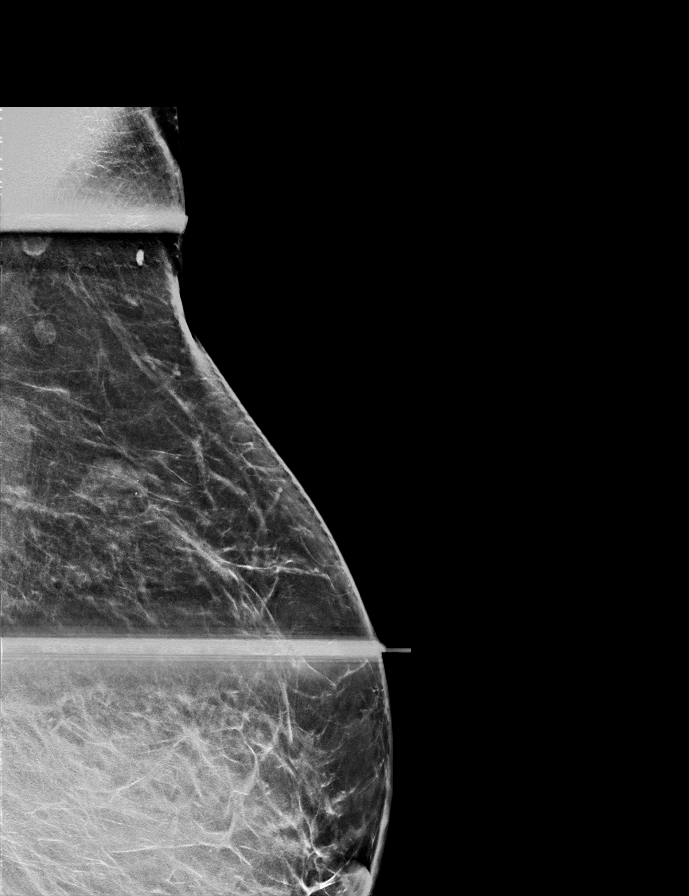

[L XCCL synth-2D (2 of 2)]
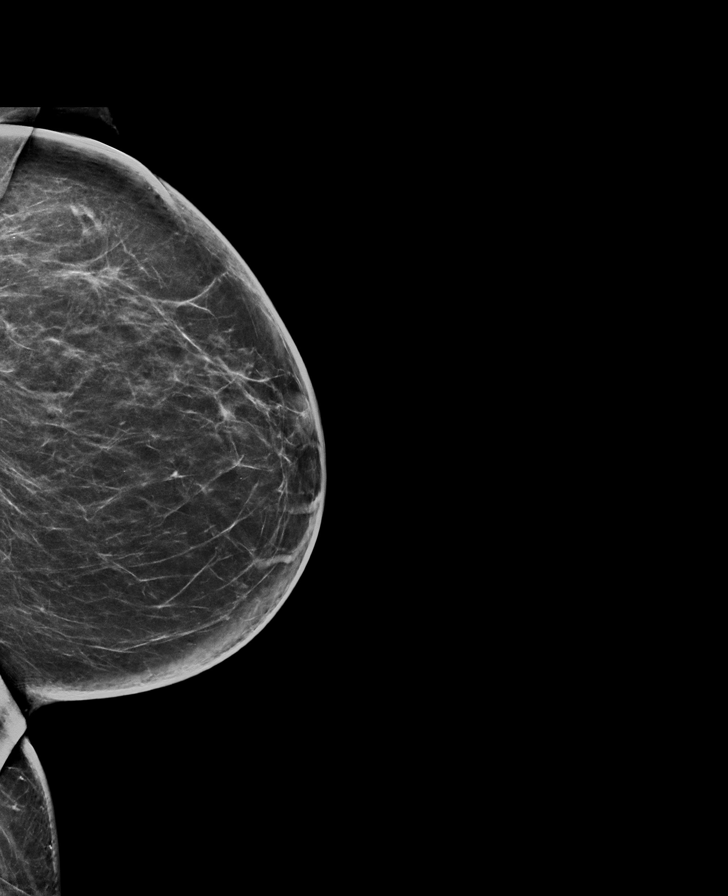

[L ML tomo · 2 of 65 frames shown]
[frame 21/65]
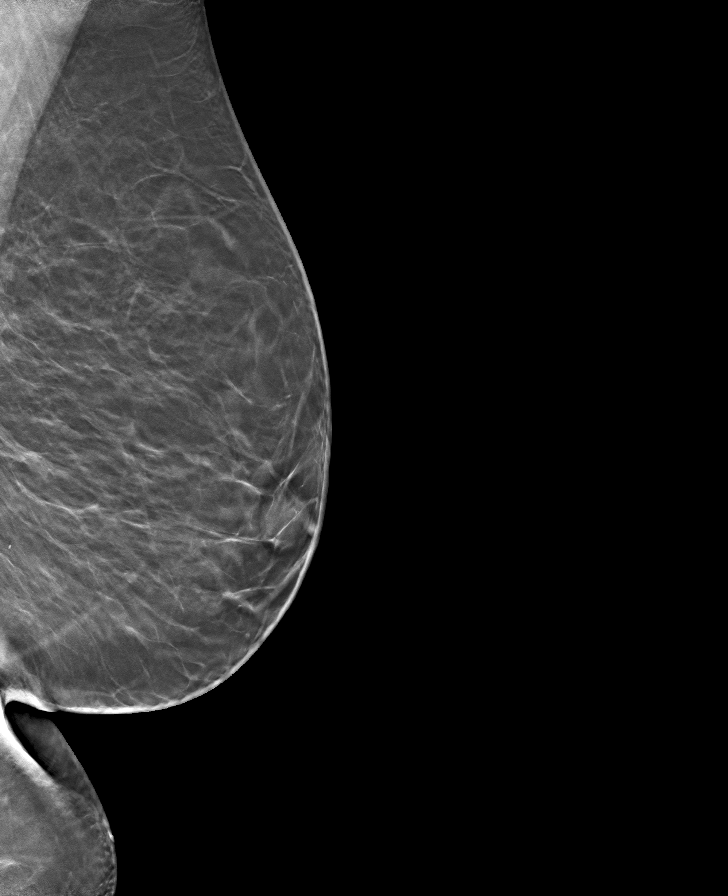
[frame 33/65]
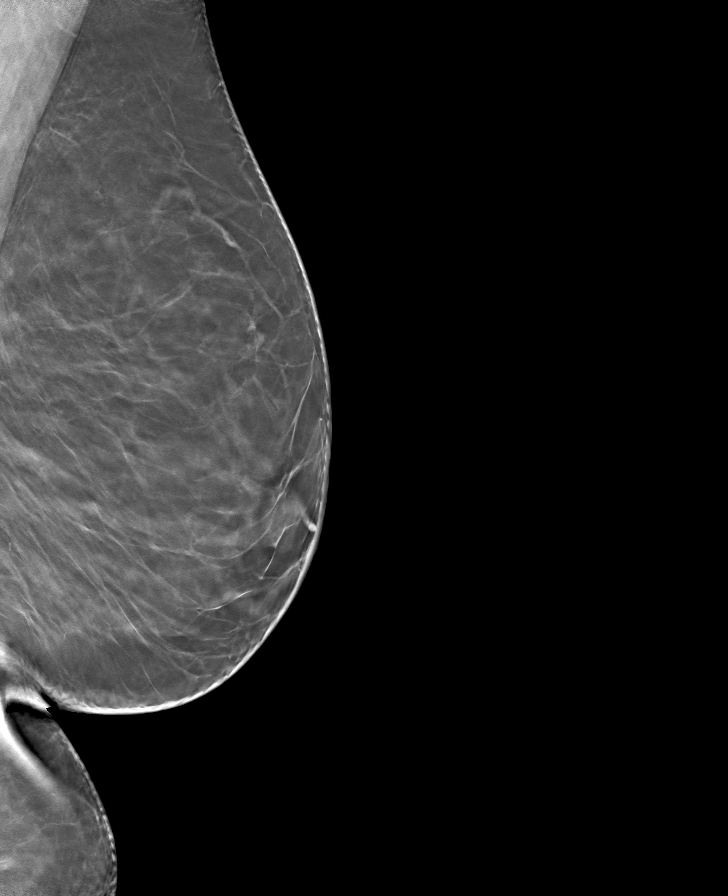

[L XCCL tomo (1 of 2) · tomo slice 31/61.0]
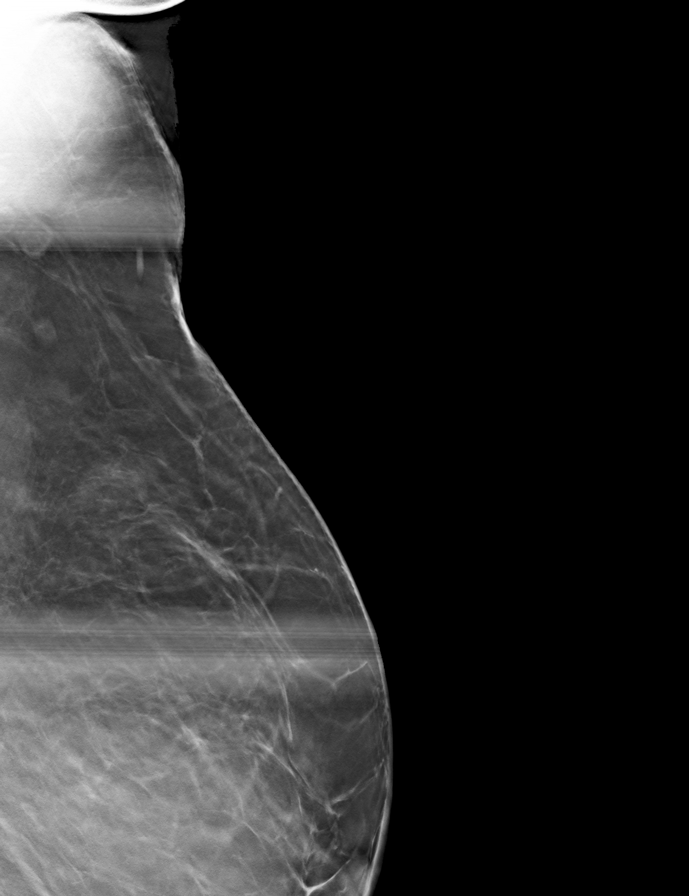

[L XCCL tomo (2 of 2) · tomo slice 33/65.0]
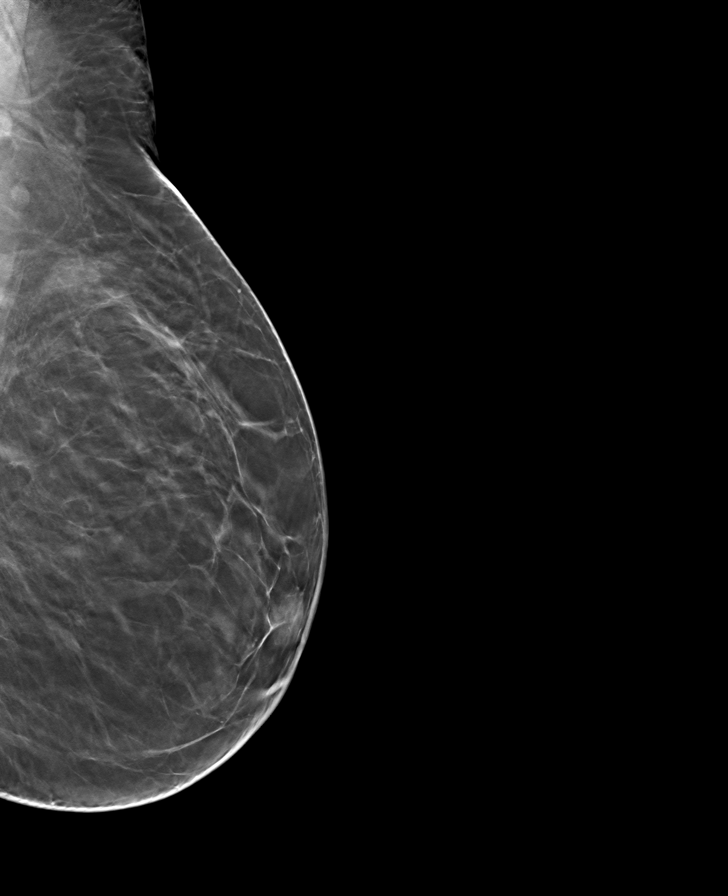

[L MLO tomo · tomo slice 33/64.0]
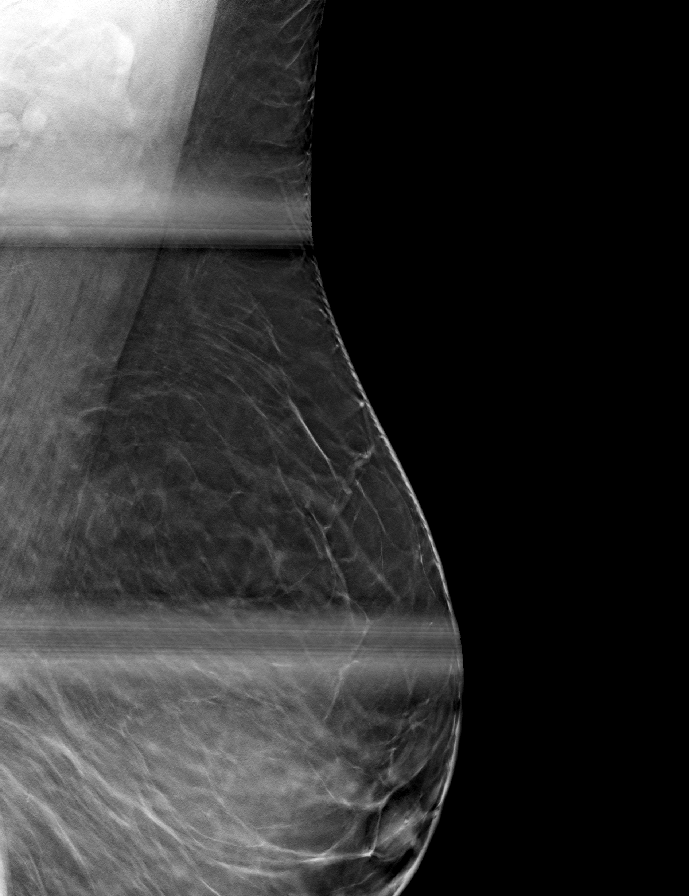

[8 of 23 positions shown; findings below may reference images not displayed]

ACR Breast Density Category b: There are scattered areas of
fibroglandular density.
FINDINGS: A developing asymmetry in the upper outer right breast at posterior
depth persists on today's additional views and demonstrates
associated calcifications. Further evaluation with ultrasound was
performed.

Mammographic images were processed with CAD.

Targeted ultrasound is performed, showing a circumscribed,
heterogeneous mass at the 2 o'clock position 8 cm from the nipple.
It measures 1.7 x 1.2 x 0.5 cm. Note is made of twinkle artifact
from likely associated calcifications. The appearance is most
suggestive of a cyst cluster/complicated cyst and may correspond
with the screening mammographic findings.

Evaluation of the left axilla demonstrates no suspicious
lymphadenopathy.
IMPRESSION: 1. Indeterminate left breast mass likely corresponding with the
screening mammographic findings. The appearance is most suggestive
of a cluster of cysts/complicated cysts. Recommendation is for
ultrasound-guided biopsy for definitive tissue diagnosis with
careful attention to post clip mammograms to insure correlation with
the screening mammographic findings. If this area does not
correlate, additional 3D stereotactic biopsy is recommended.
2. No suspicious left axillary lymphadenopathy.

RECOMMENDATION:
1. Ultrasound-guided biopsy for an indeterminate left breast mass.
2. Careful attention on post clip mammograms is recommended to
ensure correlation with the screening mammographic findings. If this
area does not correlate, additional 3D stereotactic biopsy is
warranted.

I have discussed the findings and recommendations with the patient.
Results were also provided in writing at the conclusion of the
visit. If applicable, a reminder letter will be sent to the patient
regarding the next appointment.

BI-RADS CATEGORY  4: Suspicious.

## 2019-07-21 IMAGING — MG MM CLIP PLACEMENT
2 series · 2 of 2 positions shown · non-contrast
Comparison: Previous exam(s).

CLINICAL DATA: Post ultrasound-guided biopsy of the mass in the
left breast at 2 o'clock 8 cm from nipple.

EXAM:
DIAGNOSTIC LEFT MAMMOGRAM POST ULTRASOUND BIOPSY

[L CC]
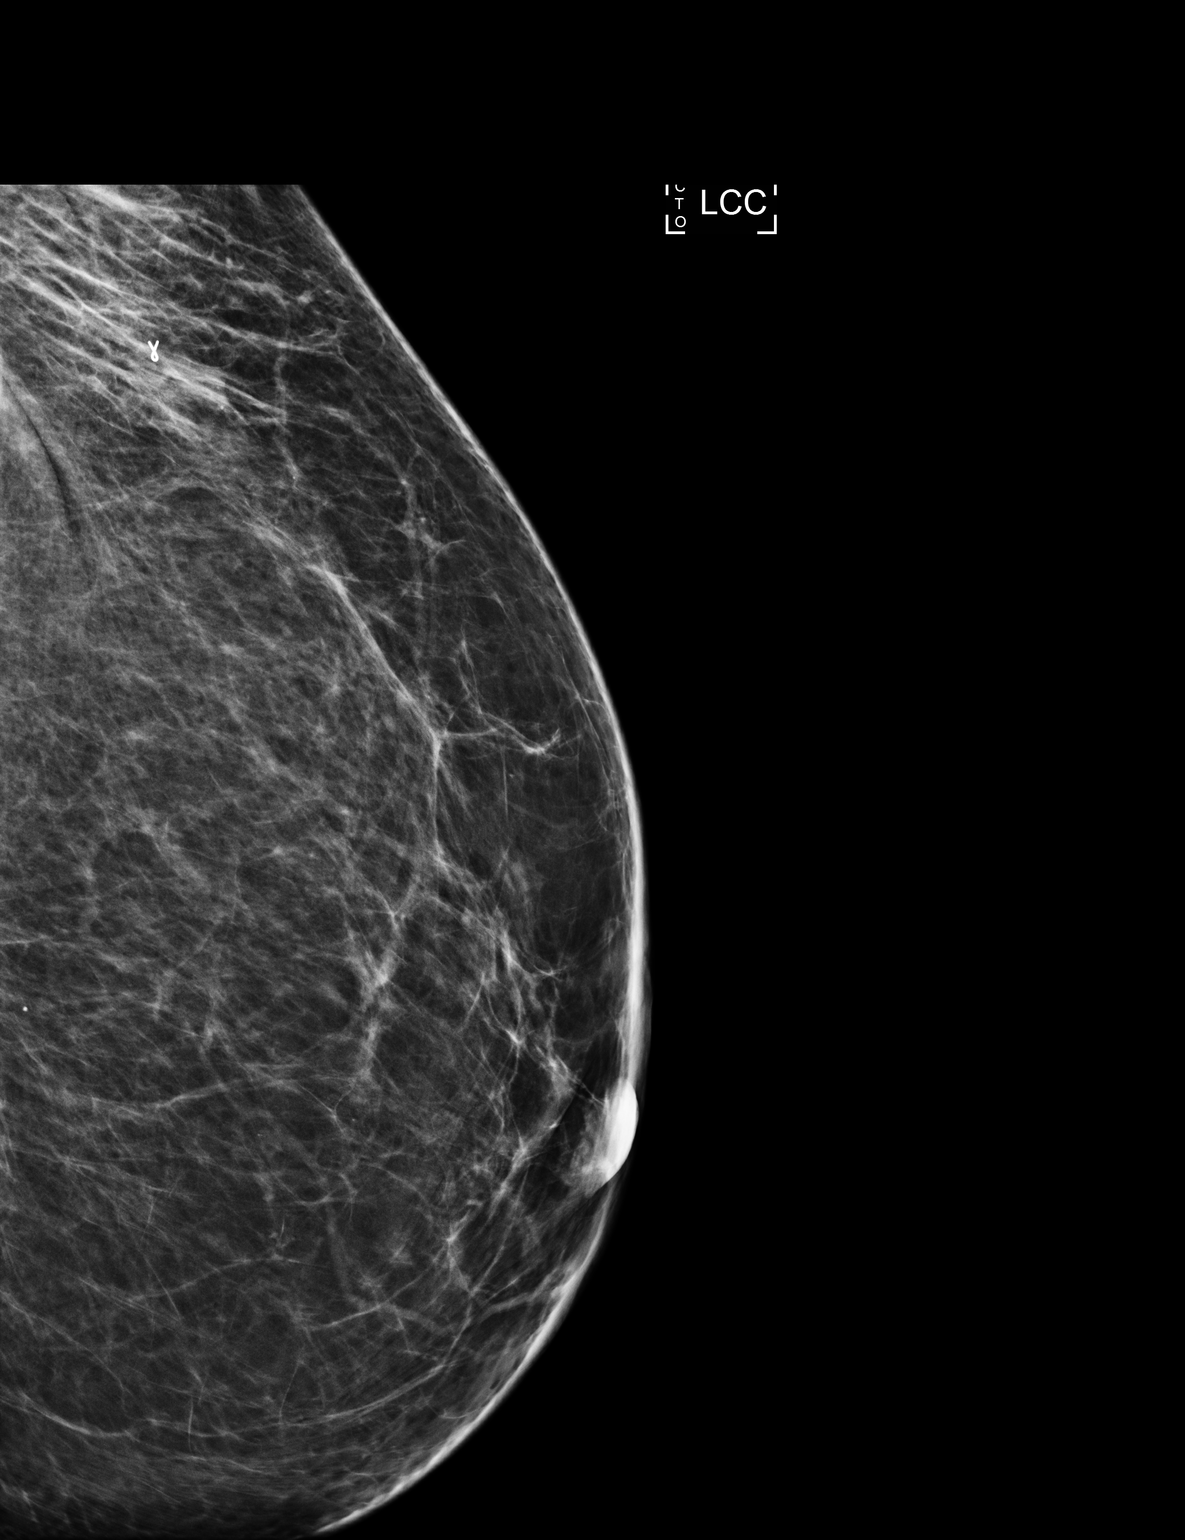

[L ML]
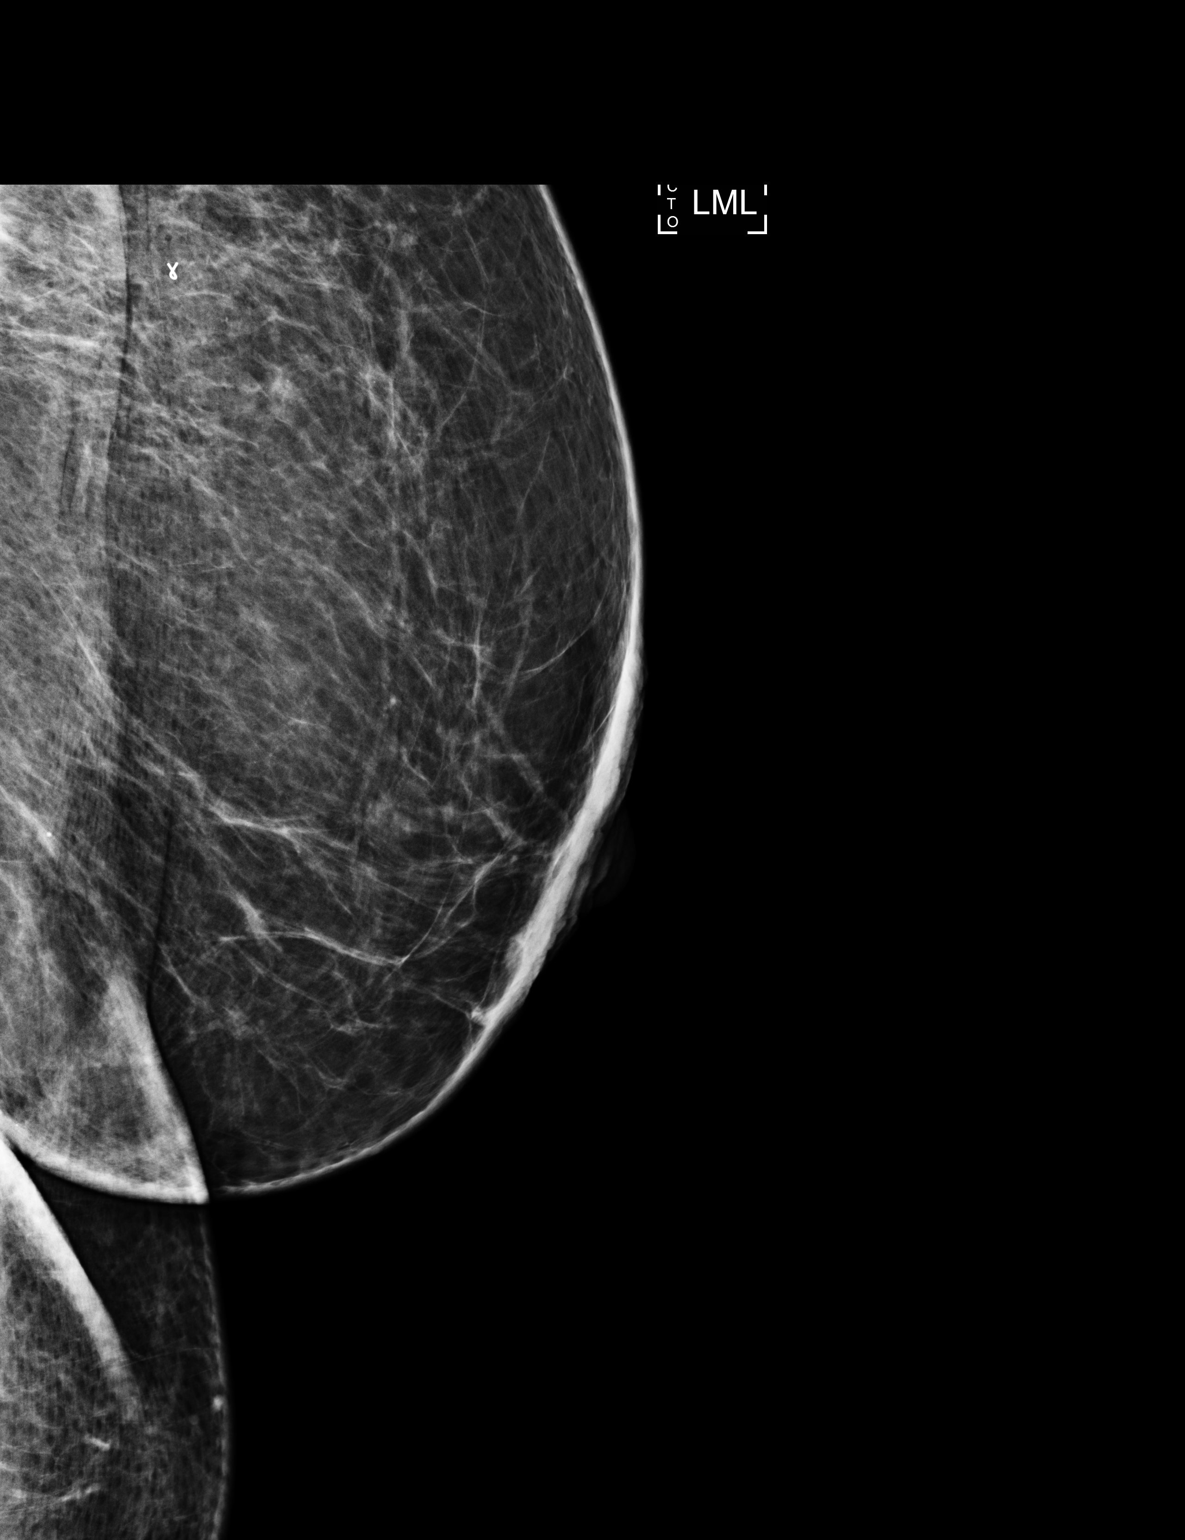

[2 of 2 positions shown; findings below may reference images not displayed]

FINDINGS: Mammographic images were obtained following ultrasound guided biopsy
of a mass in the left breast at the 2 o'clock position. A ribbon
shaped biopsy marking clip is present at the site of the biopsied
mass in the left breast at the 2 o'clock position.
IMPRESSION: Ribbon shaped biopsy marking clip at site of biopsied mass in the
left breast at the 2 o'clock position.

Final Assessment: Post Procedure Mammograms for Marker Placement

## 2019-07-21 IMAGING — US US BREAST BX W LOC DEV 1ST LESION IMG BX SPEC US GUIDE*L*
1 series · 13 of 14 positions shown · non-contrast
Comparison: Previous exam(s).

ADDENDUM:
Pathology of the left breast biopsy revealed benign breast tissue
with fibrocystic changes. Negative for carcinoma.

This was found to be concordant with Dr. Mulet impression and
notes.
Recommendation: Routine screening mammogram in one year.
At the patient's request, results and recommendations were relayed
to the patient by phone by Alva, Hammer on 01/26/18. The patient
stated she did well following the biopsy with a small bruise and
mild tenderness. Post biopsy instructions were reviewed with the
patient and all of her questions were answered. She was encouraged
to contact the imaging department of [HOSPITAL] with any
further questions or concerns.
Addendum by Alva, Hammer on 01/26/18.
CLINICAL DATA: 57-year-old female with an indeterminate mass in the
left breast at the 2 o'clock position 8 cm from nipple.
EXAM:
ULTRASOUND GUIDED LEFT BREAST CORE NEEDLE BIOPSY

[Series 1: us breast bx w loc dev 1st lesion img bx spec us g · 0.07mm/px · 13 of 14 slices shown]
[im 1/14]
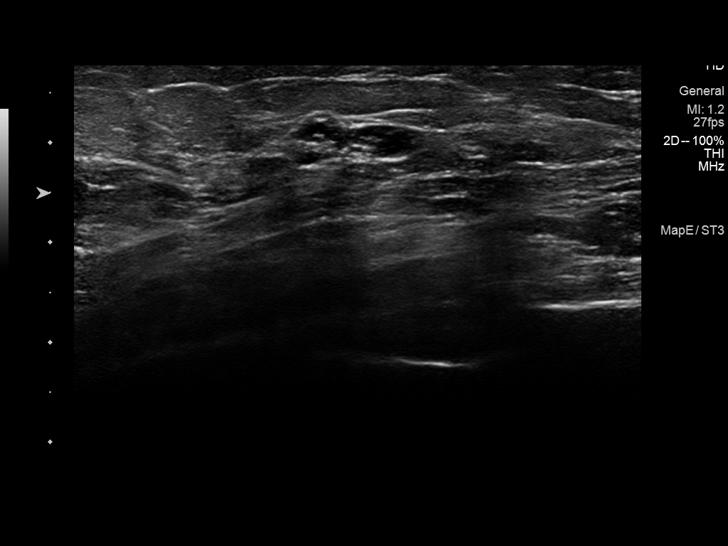
[im 2/14]
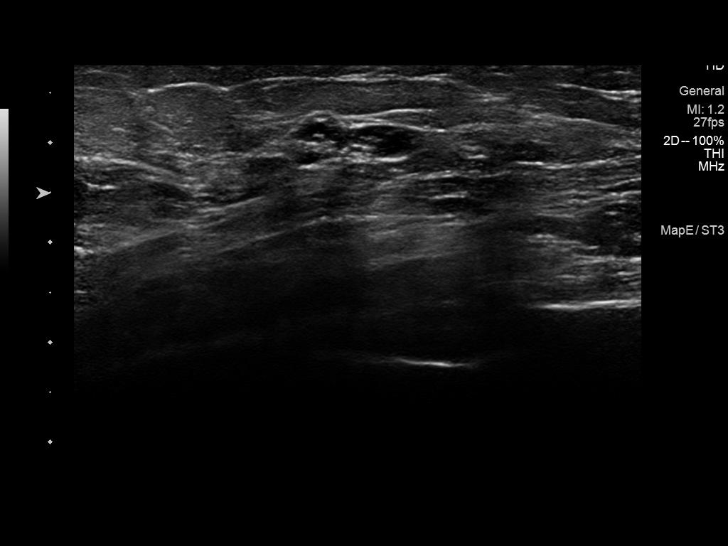
[im 3/14]
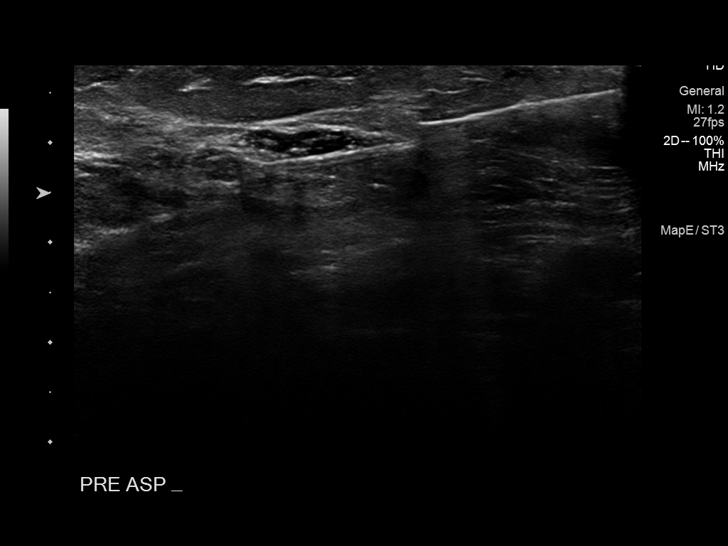
[im 4/14]
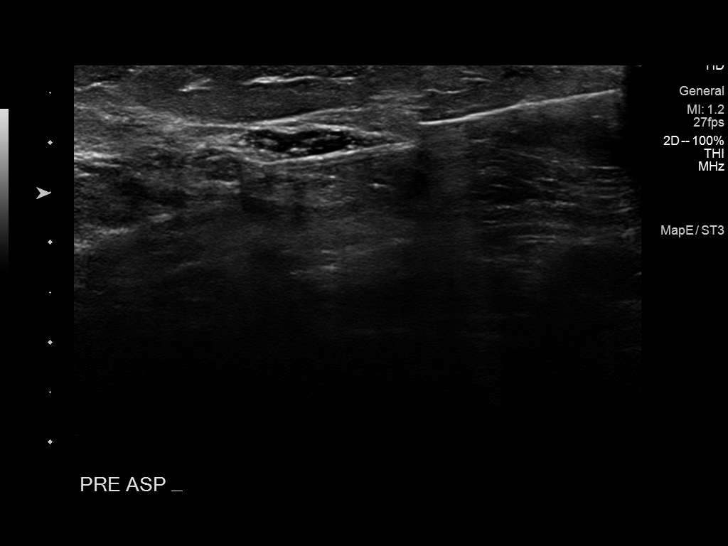
[im 5/14]
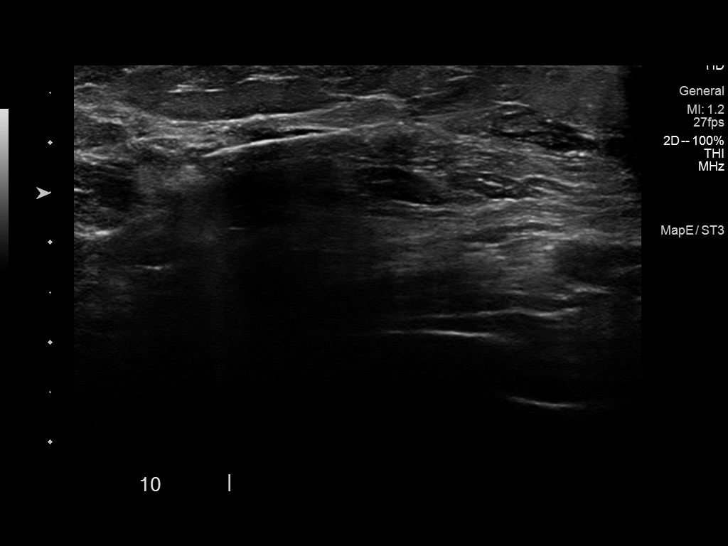
[im 6/14]
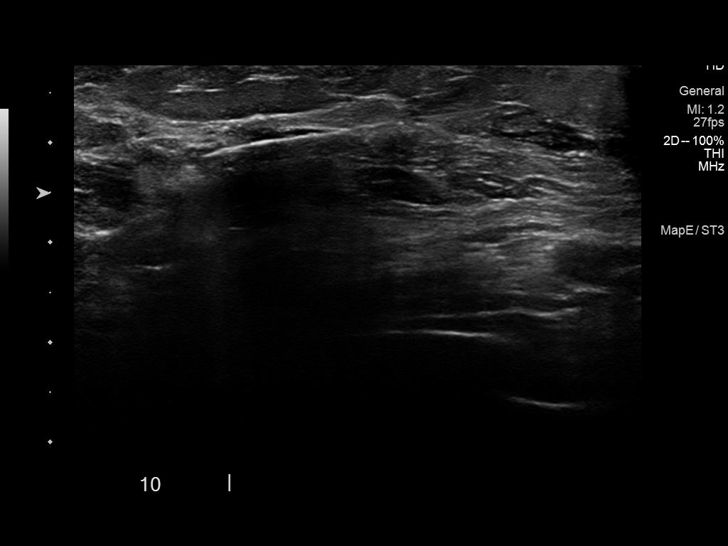
[im 8/14]
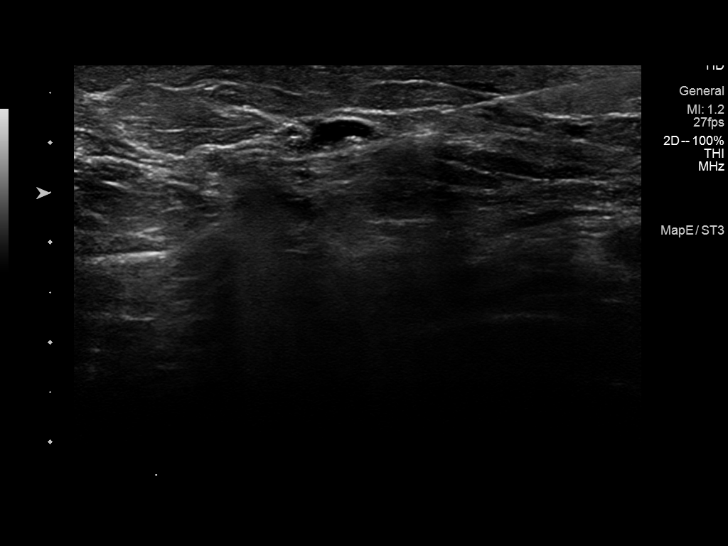
[im 9/14]
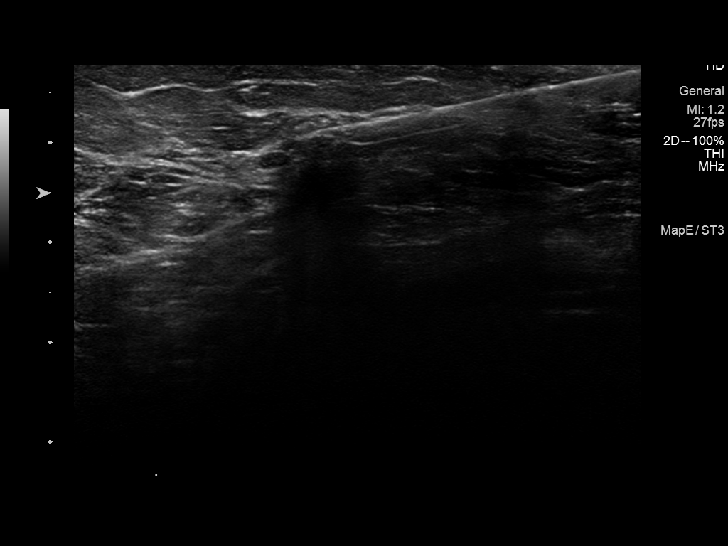
[im 10/14]
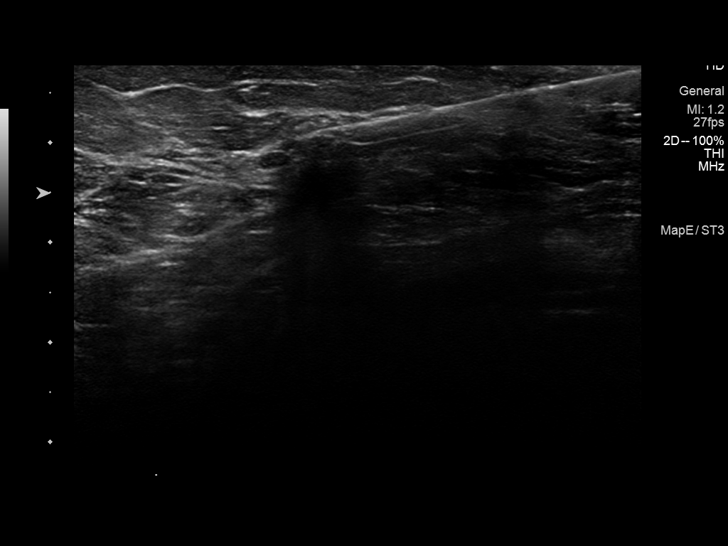
[im 11/14]
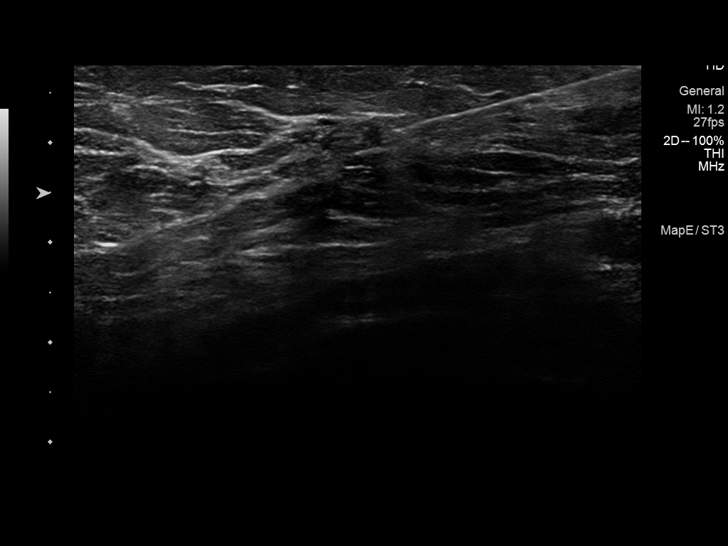
[im 12/14]
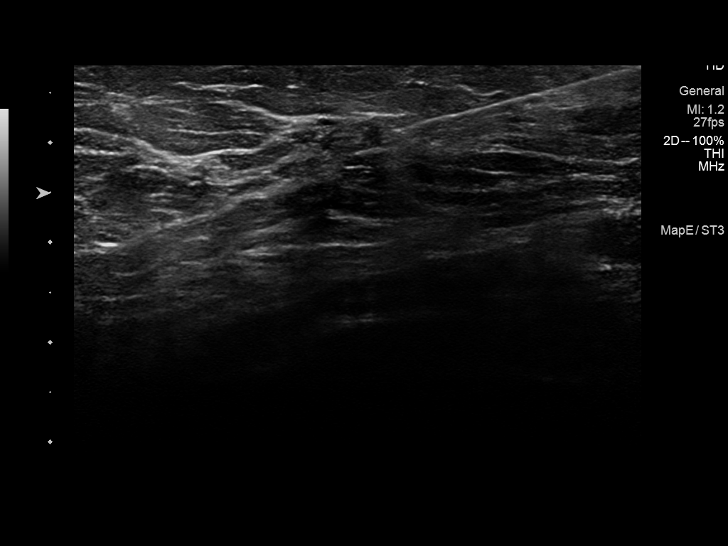
[im 13/14]
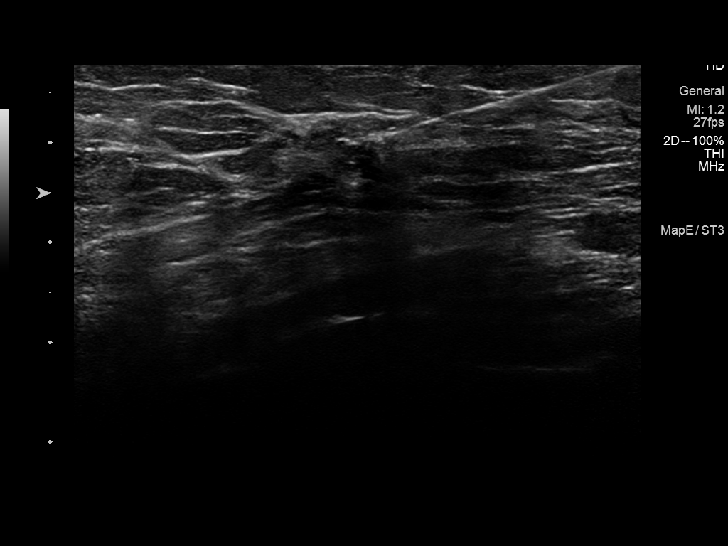
[im 14/14]
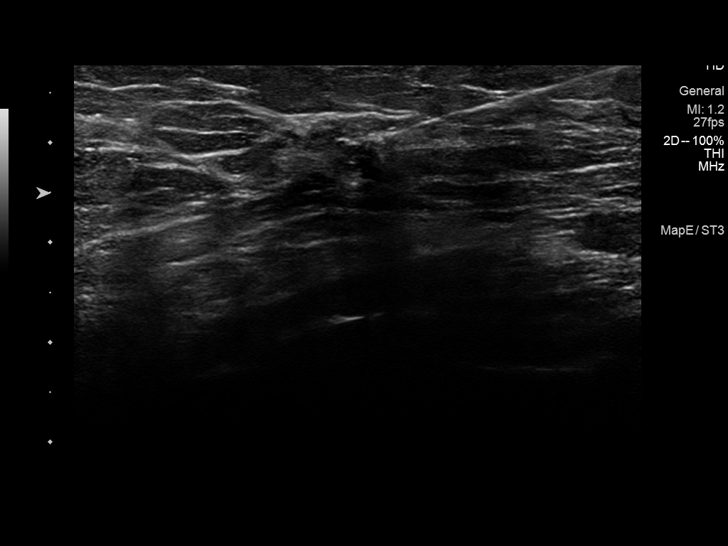

[13 of 14 positions shown; findings below may reference images not displayed]



Lesion quadrant: Upper-outer

Using sterile technique and 1% Lidocaine as local anesthetic, under
direct ultrasound visualization, a 12 gauge Kazutora device was
used to perform biopsy of the mass in the upper-outer left breast at
the 2 o'clock position using a lateral to medial approach. At the
conclusion of the procedure a ribbon shaped tissue marker clip was
deployed into the biopsy cavity. Follow up 2 view mammogram was
performed and dictated separately.
IMPRESSION: Ultrasound guided biopsy of the mass in the left breast at the 2
o'clock position. No apparent complications.

## 2019-08-01 ENCOUNTER — Ambulatory Visit (INDEPENDENT_AMBULATORY_CARE_PROVIDER_SITE_OTHER): Payer: 59 | Admitting: Adult Health

## 2019-08-01 ENCOUNTER — Encounter: Payer: Self-pay | Admitting: Adult Health

## 2019-08-01 ENCOUNTER — Other Ambulatory Visit: Payer: Self-pay

## 2019-08-01 VITALS — BP 122/71 | HR 73 | Ht 69.0 in | Wt 195.0 lb

## 2019-08-01 DIAGNOSIS — L0292 Furuncle, unspecified: Secondary | ICD-10-CM | POA: Insufficient documentation

## 2019-08-01 DIAGNOSIS — Z1212 Encounter for screening for malignant neoplasm of rectum: Secondary | ICD-10-CM | POA: Diagnosis not present

## 2019-08-01 DIAGNOSIS — N941 Unspecified dyspareunia: Secondary | ICD-10-CM | POA: Diagnosis not present

## 2019-08-01 DIAGNOSIS — Z1211 Encounter for screening for malignant neoplasm of colon: Secondary | ICD-10-CM | POA: Diagnosis not present

## 2019-08-01 DIAGNOSIS — Z01419 Encounter for gynecological examination (general) (routine) without abnormal findings: Secondary | ICD-10-CM

## 2019-08-01 DIAGNOSIS — R102 Pelvic and perineal pain: Secondary | ICD-10-CM | POA: Insufficient documentation

## 2019-08-01 DIAGNOSIS — N898 Other specified noninflammatory disorders of vagina: Secondary | ICD-10-CM

## 2019-08-01 LAB — HEMOCCULT GUIAC POC 1CARD (OFFICE): Fecal Occult Blood, POC: NEGATIVE

## 2019-08-01 MED ORDER — SULFAMETHOXAZOLE-TRIMETHOPRIM 800-160 MG PO TABS: 1.0000 | ORAL_TABLET | Freq: Two times a day (BID) | ORAL | 0 refills | Status: DC

## 2019-08-01 MED ORDER — PREMARIN 0.625 MG/GM VA CREA: TOPICAL_CREAM | VAGINAL | 0 refills | Status: DC

## 2019-08-01 NOTE — Progress Notes (Addendum)
Patient ID: Julia Wang, female   DOB: 1961-06-04, 59 y.o.   MRN: [id number] History of Present Illness: Julia Wang is a 59 year old white female, married, PM in for a well woman gyn exam. She thinks she had a pap in January at PCP.She is complaining of vaginal dryness and pain with sex. She had labs with Pablo Lawrence NP.  PCP is Dr Nevada Crane.  Current Medications, Allergies, Past Medical History, Past Surgical History, Family History and Social History were reviewed in [employer] record.     Review of Systems: Patient denies any headaches, hearing loss, fatigue, blurred vision, shortness of breath, chest pain, abdominal pain, problems with bowel movements, urination. No joint pain or mood swings. +vaginal dryness and pain with sex +joint pain in elbows  Has ?boil in mons pubis   Physical Exam:BP 122/71 (BP Location: Left Arm, Patient Position: Sitting, Cuff Size: Normal)   Pulse 73   Ht 5\' 9"  (1.753 m)   Wt 195 lb (88.5 kg)   LMP 12/24/2017   BMI 28.80 kg/m  General:  Well developed, well nourished, no acute distress Skin:  Warm and dry Neck:  Midline trachea, normal thyroid, good ROM, no lymphadenopathy Lungs; Clear to auscultation bilaterally Breast:  No dominant palpable mass, retraction, or nipple discharge Cardiovascular: Regular rate and rhythm Abdomen:  Soft, non tender, no hepatosplenomegaly Pelvic:  External genitalia is normal in appearance, has multiple angiokeratoma, has 2 cm firmness under skin of mons pubis, tender  The vagina is pale and dry, with loss of rugae. Urethra has no lesions or masses. The cervix is bulbous and smooth.  Uterus is felt to be normal size, shape, and contour.  No adnexal masses, general  tenderness noted.Bladder is non tender, no masses felt. Rectal: Good sphincter tone, no polyps, or hemorrhoids felt.  Hemoccult negative. Extremities/musculoskeletal:  No swelling or varicosities noted, no clubbing or cyanosis Psych:  No mood  changes, alert and cooperative,seems happy Fall risk is low PHQ 2 score is 0 Examination chaperoned by Terri Skains NP student who assisted with exam.  Impression and Plan:  1. Encounter for well woman exam with routine gynecological exam Physical in 1 year Get pap results from PCP Mammogram yearly  2. Screening for colorectal cancer Colonoscopy per GI  3. Tenderness of female pelvic organs Will get GYN [place] in about a week  4. Dyspareunia in female Will add PVC Given 24 gm or 6 tubes premarin vaginal cream to use 0.5 gm in vagina nightly x 2 weeks then 2 x weekly    5. Vaginal dryness Will add PVC   6. Boil Will rx septra ds and can use warm compresses, do not  squeeze  Rx septra ds 1 bid x 14 days

## 2019-08-09 ENCOUNTER — Other Ambulatory Visit: Payer: Self-pay

## 2019-08-09 ENCOUNTER — Ambulatory Visit (INDEPENDENT_AMBULATORY_CARE_PROVIDER_SITE_OTHER): Payer: 59

## 2019-08-09 DIAGNOSIS — R102 Pelvic and perineal pain: Secondary | ICD-10-CM | POA: Diagnosis not present

## 2019-08-09 NOTE — Progress Notes (Signed)
PELVIC US TA/TV:heterogeneous anteverted uterus with mult fibroids,(#1) anterior fundal intramural fibroid 1.2 x 1.2 x 1.8 cm,(#2) intramural fundal fibroid 2.3 x 2 x 2.8 cm,(#3) posterior intramural fibroid 2.8 x 2 x 2.7 cm,mult simple nabothian cysts,EEC 2.6 mm,normal right ovary,complex left adnexal cyst with low level echoes (endometrioma) 5.5 x 5 x 6.1 cm,exophytic left ovarian cyst with low level echoes 2.3 x 1.6 x 2.2 cm,unable to slide ovaries   Chaperone Estill Bamberg

## 2019-08-14 ENCOUNTER — Telehealth: Payer: Self-pay | Admitting: Adult Health

## 2019-08-14 NOTE — Telephone Encounter (Signed)
Left message, still has cyst left ovary, lets check CA 125 and talk more, can call me or talk at visit 3/9. Uterus has fibroids

## 2019-08-22 ENCOUNTER — Ambulatory Visit (INDEPENDENT_AMBULATORY_CARE_PROVIDER_SITE_OTHER): Payer: 59 | Admitting: Adult Health

## 2019-08-22 ENCOUNTER — Other Ambulatory Visit: Payer: Self-pay

## 2019-08-22 ENCOUNTER — Encounter: Payer: Self-pay | Admitting: Adult Health

## 2019-08-22 VITALS — BP 127/80 | HR 71 | Ht 69.5 in | Wt 197.2 lb

## 2019-08-22 DIAGNOSIS — R52 Pain, unspecified: Secondary | ICD-10-CM | POA: Diagnosis not present

## 2019-08-22 DIAGNOSIS — N83202 Unspecified ovarian cyst, left side: Secondary | ICD-10-CM

## 2019-08-22 DIAGNOSIS — N898 Other specified noninflammatory disorders of vagina: Secondary | ICD-10-CM | POA: Diagnosis not present

## 2019-08-22 NOTE — Progress Notes (Signed)
  Subjective:     Patient ID: Julia Wang, female   DOB: April 24, 1961, 59 y.o.   MRN: [id number]  HPI Julia Wang is a 59 year old white female, married, PM back in follow up on vaginal dryness, boil and to discuss [place]. PCP is Dr Nevada Crane.  Review of Systems Boil has almost resolved Vaginal feels much better Has aches in both elbows and knees, had 2 tick bites last year,she requests lyme test Reviewed past medical,surgical, social and family history. Reviewed medications and allergies.     Objective:   Physical Exam BP 127/80 (BP Location: Left Arm, Patient Position: Sitting, Cuff Size: Normal)   Pulse 71   Ht 5' 9.5" (1.765 m)   Wt 197 lb 3.2 oz (89.4 kg)   LMP 12/24/2017   BMI 28.70 kg/m  Skin warm and dry.  Lungs: clear to ausculation bilaterally. Cardiovascular: regular rate and rhythm. Has point tenderness on both elbows, no redness or swelling.  She declines exam.  US showed multiple small fibroids in uterus, EEC 2.6 mm right ovary is normal and left ovary has 2 cysts, ?endometrioms, that appear stable.    Assessment:       1. Cyst of left ovary Check CA125  2. Vaginal dryness Continue PVC 0.5 gm twice a week, gave her 24 gm  3. Body aches Check for lyme disease    Plan:     Will talk when results back Follow up prn

## 2019-08-26 LAB — LYME DISEASE, WESTERN BLOT
IgG P18 Ab.: ABSENT
IgG P28 Ab.: ABSENT
IgG P30 Ab.: ABSENT
IgG P45 Ab.: ABSENT
IgG P66 Ab.: ABSENT
IgM P23 Ab.: ABSENT
IgM P39 Ab.: ABSENT
IgM P41 Ab.: ABSENT
Lyme IgG Wb: POSITIVE — AB
Lyme IgM Wb: NEGATIVE

## 2019-08-26 LAB — CA 125: Cancer Antigen (CA) 125: 12.6 U/mL (ref 0.0–38.1)

## 2019-08-28 ENCOUNTER — Telehealth: Payer: Self-pay | Admitting: *Deleted

## 2019-08-28 MED ORDER — DOXYCYCLINE HYCLATE 100 MG PO TABS: 100.0000 mg | ORAL_TABLET | Freq: Two times a day (BID) | ORAL | 0 refills | Status: DC

## 2019-08-28 NOTE — Telephone Encounter (Signed)
Pt states that she would like Jenn to let her know what the lyme disease test results mean.

## 2019-08-28 NOTE — Telephone Encounter (Signed)
Pt aware lyme test +, will rx doxycycline 100 mg bid for 28 days

## 2019-08-28 NOTE — Addendum Note (Signed)
Addended by: Derrek Monaco A on: 08/28/2019 03:02 PM   Modules accepted: Orders

## 2019-09-18 ENCOUNTER — Telehealth: Payer: Self-pay | Admitting: Adult Health

## 2019-09-18 MED ORDER — FLUCONAZOLE 150 MG PO TABS: ORAL_TABLET | ORAL | 1 refills | Status: DC

## 2019-09-18 NOTE — Telephone Encounter (Signed)
Patient called stating that she would like Anderson Malta to call her in something for a yeast infection, pt states that [place] prescribed her an Antibiotic and it gave her a yeast infection. Please contact pt

## 2019-09-18 NOTE — Telephone Encounter (Signed)
Will rx diflucan  

## 2019-09-18 NOTE — Addendum Note (Signed)
Addended by: Derrek Monaco A on: 09/18/2019 01:03 PM   Modules accepted: Orders

## 2019-10-19 ENCOUNTER — Other Ambulatory Visit: Payer: Self-pay

## 2019-10-19 ENCOUNTER — Encounter: Payer: Self-pay | Admitting: Plastic Surgery

## 2019-10-19 ENCOUNTER — Ambulatory Visit (INDEPENDENT_AMBULATORY_CARE_PROVIDER_SITE_OTHER): Payer: 59 | Admitting: Plastic Surgery

## 2019-10-19 VITALS — BP 147/86 | HR 85 | Temp 98.4°F

## 2019-10-19 DIAGNOSIS — C439 Malignant melanoma of skin, unspecified: Secondary | ICD-10-CM | POA: Diagnosis not present

## 2019-10-19 NOTE — Progress Notes (Signed)
Referring Provider Celene Squibb, MD [place],  [place] [address]   CC: No chief complaint on file.    Malignant melanoma left breast  Julia Wang is an 59 y.o. female.  HPI: Patient presents with a newly diagnosed superficial melanoma on the medial inner aspect of the left breast.  She had a pigmented mole there for some time and noticed some change in it.  She then presented to her dermatologist who did a shave biopsy that showed a 0.33 mm thick superficial spreading melanoma arising within a dysplastic nevus.  Mitotic index is 0 and there is no other concerning features on the pathology.  The margins from the shave were read as uninvolved peripherally and deep.  She has not had previous melanomas but has had quite a few of atypical moles excised.  Allergies  Allergen Reactions  . Daypro [Oxaprozin]     hives    Outpatient Encounter Medications as of 10/19/2019  Medication Sig  . ALPRAZolam (XANAX) 1 MG tablet TK 1 T PO TID PRF ANXIETY  . aspirin EC 81 MG tablet Take 81 mg by mouth daily.  Marland Kitchen conjugated estrogens (PREMARIN) vaginal cream Use 5 [place] Progression Recent Vital Signs  @VS @   Past Medical History: No date: Diabetes mellitus without complication (HCC) No date: Pre-diabetes No date: SVT (supraventricular tachycardia) ([place])   Expected Discharge Date    Diet Order    None      VTE Documentation     Work Intensity Score/Level of Care    @LEVELOFCARE @   Mobility       Significant Events   DC Barriers  Use 0.5 gm in vagina at bedtime for 2 weeks then 2 x weekly  . CRANBERRY PO Take by mouth every morning.  . diclofenac (VOLTAREN) 75 MG EC tablet TAKE 1 TABLET BY MOUTH TWICE DAILY FOR 2 WEEKS THEN AS NEEDED FOR INFLAMMATION  . doxycycline (VIBRA-TABS) 100 MG tablet Take 1 tablet (100 mg total) by mouth 2 (two) times daily.  . fluconazole (DIFLUCAN) 150 MG tablet Take 1 now and repeat one in 3 days  . fluticasone (FLONASE) 50 MCG/ACT  nasal spray SPRAY ONCE INTO EACH NOSTRIL ONCE DAILY  . metFORMIN (GLUCOPHAGE) 500 MG tablet TK 1 T PO BID  . metoprolol succinate (TOPROL-XL) 25 MG 24 hr tablet Take 25 mg by mouth daily.  . metoprolol tartrate (LOPRESSOR) 50 MG tablet Take 50 mg by mouth daily as needed.  . Multiple Vitamins-Minerals (HAIR SKIN AND NAILS FORMULA PO) Take by mouth.  . sertraline (ZOLOFT) 50 MG tablet Take 75 mg by mouth daily.    No facility-administered encounter medications on file as of 10/19/2019.     Past Medical History:  Diagnosis Date  . Diabetes mellitus without complication ([place])   . Pre-diabetes   . SVT (supraventricular tachycardia) (HCC)     Past Surgical History:  Procedure Laterality Date  . CESAREAN SECTION  1986  . CHOLECYSTECTOMY    . COLONOSCOPY N/A 05/02/2015   Procedure: COLONOSCOPY;  Surgeon: Rogene Houston, MD;  Location: [facility];  Service: Endoscopy;  Laterality: N/A;  1030    Family History  Problem Relation Age of Onset  . Cancer Mother   . Heart failure Father   . Breast cancer Sister   . Cancer Brother        lung  . Alcoholism Brother     Social History   Social History Narrative  .  Not on file     Review of Systems General: Denies fevers, chills, weight loss CV: Denies chest pain, shortness of breath, palpitations  Physical Exam Vitals with BMI 10/19/2019 08/22/2019 08/01/2019  Height - 5' 9.5" 5\' 9"   Weight - 197 lbs 3 oz 195 lbs  BMI - [id number]  Systolic [id number]  Diastolic 86 80 71  Pulse 85 71 73    General:  No acute distress,  Alert and oriented, Non-Toxic, Normal speech and affect Left breast medially shows a shave biopsy site that is about 1.5 cm in greatest dimension.  There is no surrounding suspicious lesions.  Assessment/Plan Patient presents with a superficial melanoma of the left breast.  We discussed wide local excision as her likely definitive treatment.  I explained I would take 1 cm margins circumferentially and be able to  close the wound primarily.  We discussed the risks include bleeding, infection, damage to surrounding structures, need for additional procedures.  We discussed that any further treatment would be dictated based on final pathology.  She is fully understanding and will get this scheduled for her tomorrow.  Cindra Presume 10/19/2019, 1:13 PM

## 2019-10-20 ENCOUNTER — Ambulatory Visit (INDEPENDENT_AMBULATORY_CARE_PROVIDER_SITE_OTHER): Payer: 59 | Admitting: Plastic Surgery

## 2019-10-20 ENCOUNTER — Other Ambulatory Visit (HOSPITAL_COMMUNITY)
Admission: RE | Admit: 2019-10-20 | Discharge: 2019-10-20 | Disposition: A | Discharge status: Home / Self Care | Payer: 59 | Source: Ambulatory Visit | Attending: Plastic Surgery | Admitting: Plastic Surgery

## 2019-10-20 ENCOUNTER — Encounter: Payer: Self-pay | Admitting: Plastic Surgery

## 2019-10-20 VITALS — BP 120/82 | HR 82 | Temp 97.3°F | Ht 69.0 in | Wt 197.2 lb

## 2019-10-20 DIAGNOSIS — C439 Malignant melanoma of skin, unspecified: Secondary | ICD-10-CM

## 2019-10-20 MED ORDER — HYDROCODONE-ACETAMINOPHEN 5-325 MG PO TABS: 1.0000 | ORAL_TABLET | ORAL | 0 refills | Status: DC | PRN

## 2019-10-20 NOTE — Progress Notes (Signed)
Operative Note   DATE OF OPERATION: 10/20/2019  LOCATION:    SURGICAL DEPARTMENT: Plastic Surgery  PREOPERATIVE DIAGNOSES:  T1a malignant melanoma left breast  POSTOPERATIVE DIAGNOSES:  same  PROCEDURE:  1. Excision of left breast melanoma measuring 8cm 2. Complex closure measuring 8cm  SURGEON: Talmadge Coventry, MD  ANESTHESIA:  Local  COMPLICATIONS: None.   INDICATIONS FOR PROCEDURE:  The patient, Julia Wang is a 58 y.o. female born on 10-07-1960, is here for treatment of malignant melanoma left breast. MRN: [id number]  CONSENT:  Informed consent was obtained directly from the patient. Risks, benefits and alternatives were fully discussed. Specific risks including but not limited to bleeding, infection, hematoma, seroma, scarring, pain, infection, wound healing problems, and need for further surgery were all discussed. The patient did have an ample opportunity to have questions answered to satisfaction.   DESCRIPTION OF PROCEDURE:  Local anesthesia was administered. The patient's operative site was prepped and draped in a sterile fashion. A time out was performed and all information was confirmed to be correct.  The lesion was excised with a 15 blade with a 1cm margin to a deep subcutaneous depth.  Hemostasis was obtained.  Circumferential undermining was performed and the skin was advanced and closed in layers with interrupted buried Monocryl sutures and running 5-0 fast gut for the skin.  The lesion excised measured 8cm, and the total length of closure measured 8cm.    The patient tolerated the procedure well.  There were no complications.

## 2019-10-23 LAB — SURGICAL PATHOLOGY

## 2019-11-06 ENCOUNTER — Other Ambulatory Visit: Payer: Self-pay

## 2019-11-06 ENCOUNTER — Encounter: Payer: Self-pay | Admitting: Plastic Surgery

## 2019-11-06 ENCOUNTER — Ambulatory Visit (INDEPENDENT_AMBULATORY_CARE_PROVIDER_SITE_OTHER): Payer: 59 | Admitting: Plastic Surgery

## 2019-11-06 VITALS — BP 111/66 | HR 79 | Temp 97.9°F

## 2019-11-06 DIAGNOSIS — C439 Malignant melanoma of skin, unspecified: Secondary | ICD-10-CM | POA: Diagnosis not present

## 2019-11-06 NOTE — Progress Notes (Signed)
Patient presents postop from excision of malignant melanoma from margins.  There was no residual melanoma in the specimen so she is considered treated at this point.  Her incision is healing up reasonably well.  She does have some suture line erythema that looks like it is a reaction to the sutures but otherwise no signs of a wound healing complication.  She is planning to follow-up with her dermatologist every 3 months and will follow up with [place] as needed.  All of her questions were answered.

## 2019-12-28 ENCOUNTER — Telehealth: Payer: Self-pay | Admitting: Adult Health

## 2019-12-28 ENCOUNTER — Other Ambulatory Visit: Payer: Self-pay | Admitting: Adult Health

## 2019-12-28 MED ORDER — PREMARIN 0.625 MG/GM VA CREA: TOPICAL_CREAM | VAGINAL | 1 refills | Status: DC

## 2019-12-28 NOTE — Telephone Encounter (Signed)
Pt walked in for sample premarin; sample was provided by provider Lavone Nian

## 2019-12-28 NOTE — Progress Notes (Signed)
Pt stopped by for PVC, no samples will send in RX

## 2020-01-03 ENCOUNTER — Encounter: Payer: Self-pay | Admitting: *Deleted

## 2020-01-03 NOTE — Progress Notes (Signed)
PA form for Premarin Vaginal Cream completed and office visit notes attached.

## 2020-01-05 ENCOUNTER — Telehealth: Payer: Self-pay | Admitting: *Deleted

## 2020-01-05 MED ORDER — ESTRADIOL 0.1 MG/GM VA CREA: 1.0000 | TOPICAL_CREAM | Freq: Every day | VAGINAL | 3 refills | Status: DC

## 2020-01-05 NOTE — Telephone Encounter (Signed)
No VM but will rx estrace vaginal cream, PVC denied

## 2020-01-05 NOTE — Telephone Encounter (Signed)
Premarin was denied by PA. Needs to be switched to estradiol or estring.

## 2020-01-18 ENCOUNTER — Encounter (INDEPENDENT_AMBULATORY_CARE_PROVIDER_SITE_OTHER): Payer: Self-pay | Admitting: Otolaryngology

## 2020-01-18 ENCOUNTER — Ambulatory Visit (INDEPENDENT_AMBULATORY_CARE_PROVIDER_SITE_OTHER): Payer: 59 | Admitting: Otolaryngology

## 2020-01-18 ENCOUNTER — Other Ambulatory Visit: Payer: Self-pay

## 2020-01-18 VITALS — Temp 97.2°F

## 2020-01-18 DIAGNOSIS — H6983 Other specified disorders of Eustachian tube, bilateral: Secondary | ICD-10-CM | POA: Diagnosis not present

## 2020-01-18 NOTE — Progress Notes (Signed)
HPI: Julia Wang is a 59 y.o. female who presents is referred by Pablo Lawrence, AGNP-C for evaluation of ear complaints and dizziness.  Patient states that at intermittent times she feels fluid moving in her ears or popping in her ears.  She has had intermittent left ear discomfort recently.  She has also had episodes of feeling off balance over the past 6 or 8 months.  She denies any drainage from her ears.  She has not noted any hearing problems. She has had no real episodes of vertigo and no episodes of balance problems or dizziness while in bed at night. She has recently been started on Flonase and Allegra and this seems to help a little bit..  Past Medical History:  Diagnosis Date  . Diabetes mellitus without complication ([place])   . Pre-diabetes   . SVT (supraventricular tachycardia) (HCC)    Past Surgical History:  Procedure Laterality Date  . CESAREAN SECTION  1986  . CHOLECYSTECTOMY    . COLONOSCOPY N/A 05/02/2015   Procedure: COLONOSCOPY;  Surgeon: Rogene Houston, MD;  Location[facility];  Service: Endoscopy;  Laterality: N/A;  1030   Social History   Socioeconomic History  . Marital status: Married    Spouse name: Not on file  . Number of children: Not on file  . Years of education: Not on file  . Highest education level: Not on file  Occupational History  . Not on file  Tobacco Use  . Smoking status: Never Smoker  . Smokeless tobacco: Never Used  Vaping Use  . Vaping Use: Never used  Substance and Sexual Activity  . Alcohol use: No  . Drug use: No  . Sexual activity: Yes    Birth control/protection: Surgical    Comment: vasectomy  Other Topics Concern  . Not on file  Social History Narrative  . Not on file   Social Determinants of Health   Financial Resource Strain:   . Difficulty of Paying Living Expenses:   Food Insecurity:   . Worried About Charity fundraiser in the Last Year:   . Arboriculturist in the Last Year:   Transportation Needs:   .  Film/video editor (Medical):   Marland Kitchen Lack of Transportation (Non-Medical):   Physical Activity:   . Days of Exercise per Week:   . Minutes of Exercise per Session:   Stress:   . Feeling of Stress :   Social Connections:   . Frequency of Communication with Friends and Family:   . Frequency of Social Gatherings with Friends and Family:   . Attends Religious Services:   . Active Member of Clubs or Organizations:   . Attends Archivist Meetings:   Marland Kitchen Marital Status:    Family History  Problem Relation Age of Onset  . Cancer Mother   . Heart failure Father   . Breast cancer Sister   . Cancer Brother        lung  . Alcoholism Brother    Allergies  Allergen Reactions  . Daypro [Oxaprozin]     hives   Prior to Admission medications   Medication Sig Start Date End Date Taking? Authorizing Provider  ALPRAZolam Duanne Moron) 1 MG tablet TK 1 T PO TID PRF ANXIETY 03/15/19  Yes [provider]  aspirin EC 81 MG tablet Take 81 mg by mouth daily.   Yes [provider]  CRANBERRY PO Take by mouth every morning.   Yes [provider]  diclofenac (VOLTAREN) 75 MG EC tablet TAKE 1 TABLET BY MOUTH TWICE DAILY FOR 2 WEEKS THEN AS NEEDED FOR INFLAMMATION 06/27/19  Yes [provider]  estradiol (ESTRACE VAGINAL) 0.1 MG/GM vaginal cream Place 1 Applicatorful vaginally at bedtime. 01/05/20  Yes Estill Dooms, NP  fluticasone (FLONASE) 50 MCG/ACT nasal spray SPRAY ONCE INTO EACH NOSTRIL ONCE DAILY 08/07/19  Yes [provider]  HYDROcodone-acetaminophen (NORCO) 5-325 MG tablet Take 1 tablet by mouth every 4 (four) hours as needed for moderate pain. 10/20/19  Yes Cindra Presume, MD  metFORMIN (GLUCOPHAGE) 500 MG tablet TK 1 T PO BID 08/04/17  Yes [provider]  metoprolol succinate (TOPROL-XL) 25 MG 24 hr tablet Take 25 mg by mouth daily. 03/06/19  Yes [provider]  metoprolol tartrate (LOPRESSOR) 50 MG tablet Take 50 mg by mouth  daily as needed.   Yes [provider]  Multiple Vitamins-Minerals (HAIR SKIN AND NAILS FORMULA PO) Take by mouth.   Yes [provider]  sertraline (ZOLOFT) 50 MG tablet Take 75 mg by mouth daily.    Yes [provider]  doxycycline (VIBRA-TABS) 100 MG tablet Take 1 tablet (100 mg total) by mouth 2 (two) times daily. 08/28/19   Estill Dooms, NP  fluconazole (DIFLUCAN) 150 MG tablet Take 1 now and repeat one in 3 days 09/18/19   Derrek Monaco A, NP     Positive ROS: Otherwise negative  All other systems have been reviewed and were otherwise negative with the exception of those mentioned in the HPI and as above.  Physical Exam: Constitutional: Alert, well-appearing, no acute distress Ears: External ears without lesions or tenderness.  Ear canals are narrow and angled on both sides making visualization of the entire TM difficult.  But I am able to visualize the posterior TM on both sides and the TMs have good mobility pneumatic otoscopy with no middle ear effusion noted on microscopic exam.  No ear canal abnormalities noted.  On hearing screening with the 512 1024 tuning fork she heard well in both ears with good hearing in both ears and AC > BC bilaterally.  On Dix-Hallpike testing she had no evidence of BPPV. Nasal: External nose without lesions. Septum with mild deformity and mild rhinitis.. Clear nasal passages with no signs of infection.  Nasal endoscopy revealed a clear nasopharynx and no obstruction of either eustachian tube. Oral: Lips and gums without lesions. Tongue and palate mucosa without lesions. Posterior oropharynx clear. Neck: No palpable adenopathy or masses Respiratory: Breathing comfortably  Skin: No facial/neck lesions or rash noted.  Procedures  Assessment: Normal ear canal and TM evaluation on microscopic exam in the office today.  Symptoms are consistent with eustachian tube dysfunction. Dizziness questionable etiology but no clinical  evidence of vestibular abnormality.  Plan: Agree with use of Flonase but recommended using it at night instead of in the a.m. 1 or 2 sprays each nostril every night before bedtime for 2 or 3 weeks to see if this helps. Would recommend follow-up if she notices any worsening ear pain or decrease in her hearing.   Radene Journey, MD   CC:

## 2020-01-25 ENCOUNTER — Ambulatory Visit (INDEPENDENT_AMBULATORY_CARE_PROVIDER_SITE_OTHER): Payer: 59 | Admitting: Otolaryngology

## 2020-05-13 ENCOUNTER — Other Ambulatory Visit (HOSPITAL_COMMUNITY): Payer: Self-pay | Admitting: Internal Medicine

## 2020-05-13 DIAGNOSIS — Z1231 Encounter for screening mammogram for malignant neoplasm of breast: Secondary | ICD-10-CM

## 2020-05-27 ENCOUNTER — Other Ambulatory Visit: Payer: Self-pay

## 2020-05-27 ENCOUNTER — Ambulatory Visit (HOSPITAL_COMMUNITY)
Admission: RE | Admit: 2020-05-27 | Discharge: 2020-05-27 | Disposition: A | Discharge status: Home / Self Care | Payer: 59 | Source: Ambulatory Visit | Attending: Internal Medicine | Admitting: Internal Medicine

## 2020-05-27 DIAGNOSIS — Z1231 Encounter for screening mammogram for malignant neoplasm of breast: Secondary | ICD-10-CM | POA: Insufficient documentation

## 2020-06-03 ENCOUNTER — Other Ambulatory Visit (HOSPITAL_COMMUNITY): Payer: Self-pay | Admitting: Internal Medicine

## 2020-06-03 DIAGNOSIS — R928 Other abnormal and inconclusive findings on diagnostic imaging of breast: Secondary | ICD-10-CM

## 2020-06-13 ENCOUNTER — Ambulatory Visit (HOSPITAL_COMMUNITY)
Admission: RE | Admit: 2020-06-13 | Discharge: 2020-06-13 | Disposition: A | Discharge status: Home / Self Care | Payer: 59 | Source: Ambulatory Visit | Attending: Internal Medicine | Admitting: Internal Medicine

## 2020-06-13 ENCOUNTER — Other Ambulatory Visit: Payer: Self-pay

## 2020-06-13 DIAGNOSIS — R928 Other abnormal and inconclusive findings on diagnostic imaging of breast: Secondary | ICD-10-CM

## 2020-09-10 ENCOUNTER — Encounter: Payer: Self-pay | Admitting: Adult Health

## 2020-09-10 ENCOUNTER — Other Ambulatory Visit: Payer: Self-pay

## 2020-09-10 ENCOUNTER — Ambulatory Visit (INDEPENDENT_AMBULATORY_CARE_PROVIDER_SITE_OTHER): Payer: 59 | Admitting: Adult Health

## 2020-09-10 VITALS — BP 118/82 | HR 79 | Ht 69.0 in | Wt 201.5 lb

## 2020-09-10 DIAGNOSIS — Z01419 Encounter for gynecological examination (general) (routine) without abnormal findings: Secondary | ICD-10-CM | POA: Diagnosis not present

## 2020-09-10 DIAGNOSIS — N898 Other specified noninflammatory disorders of vagina: Secondary | ICD-10-CM

## 2020-09-10 DIAGNOSIS — N83202 Unspecified ovarian cyst, left side: Secondary | ICD-10-CM

## 2020-09-10 DIAGNOSIS — Z1211 Encounter for screening for malignant neoplasm of colon: Secondary | ICD-10-CM

## 2020-09-10 LAB — HEMOCCULT GUIAC POC 1CARD (OFFICE): Fecal Occult Blood, POC: NEGATIVE

## 2020-09-10 NOTE — Progress Notes (Signed)
Patient ID: LORIANN BOSSERMAN, female   DOB: [birth year], 60 y.o.   MRN: [id number] History of Present Illness:  Ursala is a 60 year old white female,married, PM in for a well woman gyn exam, she had a normal pap 06/27/19 with PCP. She has complex left ovary cyst, had normal CA 125 08/22/19. PCP is Dr Nevada Crane.  Current Medications, Allergies, Past Medical History, Past Surgical History, Family History and Social History were reviewed in [employer] record.     Review of Systems: Patient denies any headaches, hearing loss, fatigue, blurred vision, shortness of breath, chest pain, abdominal pain, problems with bowel movements, urination, or intercourse(has vaginal dryness with sex). No joint pain or mood swings.    Physical Exam:BP 118/82 (BP Location: Left Arm, Patient Position: Sitting, Cuff Size: Large)   Pulse 79   Ht 5\' 9"  (1.753 m)   Wt 201 lb 8 oz (91.4 kg)   LMP 12/24/2017   BMI 29.76 kg/m  General:  Well developed, well nourished, no acute distress Skin:  Warm and dry Neck:  Midline trachea, normal thyroid, good ROM, no lymphadenopathy Lungs; Clear to auscultation bilaterally Breast:  No dominant palpable mass, retraction, or nipple discharge, had melanoma removed from sternum  Cardiovascular: Regular rate and rhythm Abdomen:  Soft, non tender, no hepatosplenomegaly Pelvic:  External genitalia is normal in appearance, no lesions.  The vagina is normal in appearance. Urethra has no lesions or masses. The cervix is bulbous.  Uterus is felt to be normal size, shape, and contour.  No adnexal masses or tenderness noted.Bladder is non tender, no masses felt. Rectal: Good sphincter tone, no polyps, + hemorrhoids felt.  Hemoccult negative. Extremities/musculoskeletal:  No swelling or varicosities noted, no clubbing or cyanosis Psych:  No mood changes, alert and cooperative,seems happy AA is 0 Fall risk is low PHQ 9 score is 4 GAD 7 score is 4  Upstream - 09/10/20 1055       Pregnancy Intention Screening   Does the patient want to become pregnant in the next year? No    Does the patient's partner want to become pregnant in the next year? No    Would the patient like to discuss contraceptive options today? No      Contraception Wrap Up   Current Method Vasectomy    End Method Vasectomy    Contraception Counseling Provided No          Co exam with Tinnie Gens NP student  Impression and Plan: 1. Encounter for well woman exam with routine gynecological exam Physical in 1 year Pap 2024 Mammogram year Colonoscopy per GI  2. Encounter for screening fecal occult blood testing   3. Vaginal dryness Try replens and Luvena   4. Cyst of left ovary Will get GYN [place] in about 2 weeks

## 2020-10-10 ENCOUNTER — Other Ambulatory Visit (INDEPENDENT_AMBULATORY_CARE_PROVIDER_SITE_OTHER): Payer: 59

## 2020-10-10 ENCOUNTER — Other Ambulatory Visit: Payer: Self-pay | Admitting: Obstetrics & Gynecology

## 2020-10-10 ENCOUNTER — Other Ambulatory Visit: Payer: Self-pay

## 2020-10-10 ENCOUNTER — Ambulatory Visit (INDEPENDENT_AMBULATORY_CARE_PROVIDER_SITE_OTHER): Payer: 59

## 2020-10-10 VITALS — BP 137/81 | HR 74 | Ht 69.0 in | Wt 202.0 lb

## 2020-10-10 DIAGNOSIS — N83202 Unspecified ovarian cyst, left side: Secondary | ICD-10-CM

## 2020-10-10 DIAGNOSIS — R399 Unspecified symptoms and signs involving the genitourinary system: Secondary | ICD-10-CM

## 2020-10-10 LAB — POCT URINALYSIS DIPSTICK OB
Glucose, UA: NEGATIVE
Ketones, UA: NEGATIVE
Leukocytes, UA: NEGATIVE
Nitrite, UA: POSITIVE
POC,PROTEIN,UA: NEGATIVE

## 2020-10-10 MED ORDER — SULFAMETHOXAZOLE-TRIMETHOPRIM 800-160 MG PO TABS: 1.0000 | ORAL_TABLET | Freq: Two times a day (BID) | ORAL | 0 refills | Status: DC

## 2020-10-10 NOTE — Telephone Encounter (Signed)
Called pt to inform her of prescription sent in for UTI. No answer, left vm msg

## 2020-10-10 NOTE — Progress Notes (Addendum)
   NURSE VISIT- UTI SYMPTOMS   SUBJECTIVE:  Julia Wang is a 60 y.o. G34P1011 female here for UTI symptoms. She is a GYN patient. She reports cloudy urine and burning.  OBJECTIVE:  BP 137/81 (BP Location: Right Arm, Patient Position: Sitting, Cuff Size: Normal)   Pulse 74   Ht 5\' 9"  (1.753 m)   Wt 202 lb (91.6 kg)   LMP 12/24/2017   BMI 29.83 kg/m   Appears well, in no apparent distress  Results for orders placed or performed in visit on 10/10/20 (from the past 24 hour(s))  POC Urinalysis Dipstick OB   Collection Time: 10/10/20 11:12 AM  Result Value Ref Range   Color, UA     Clarity, UA     Glucose, UA Negative Negative   Bilirubin, UA     Ketones, UA neg    Spec Grav, UA     Blood, UA small    pH, UA     POC,PROTEIN,UA Negative Negative, Trace, Small (1+), Moderate (2+), Large (3+), 4+   Urobilinogen, UA     Nitrite, UA pos    Leukocytes, UA Negative Negative   Appearance     Odor      ASSESSMENT: GYN patient with UTI symptoms and positive nitrites  PLAN: Note routed to Dr. Elonda Husky   Rx sent by provider today: No Urine culture sent Call or return to clinic prn if these symptoms worsen or fail to improve as anticipated. Follow-up: as needed   Maika Kaczmarek A Arjun Hard  10/10/2020 11:17 AM   Bactrim DS e prescribed for pt  Attestation of Attending Supervision of Nursing Visit Encounter: Evaluation and management procedures were performed by the nursing staff under my supervision and collaboration.  I have reviewed the nurse's note and chart, and I agree with the management and plan.  Jacelyn Grip MD Attending Physician for the Center for [facility] 10/10/2020 11:41 AM

## 2020-10-10 NOTE — Progress Notes (Signed)
PELVIC US TA/TV: heterogeneous anteverted uterus with mult fibroids,(#1) intramural anterior/mid uterus 1.3 x 1.3 x 1.8 cm,(#2) fundal subserosal fibroid 1.6 x 1.2 x 1.9 cm,(#2) posterior intramural fibroid 2.4 x 1.3 x 2.6 cm,EEC 3 mm,normal right ovary,6.4 x 4.8 x 5.4 cm complex left ovarian cyst with low level echoes,no internal color flow,no free fluid,no pain during ultrasound

## 2020-10-15 LAB — URINE CULTURE

## 2021-03-08 ENCOUNTER — Ambulatory Visit
Admission: EM | Admit: 2021-03-08 | Discharge: 2021-03-08 | Disposition: A | Discharge status: Home / Self Care | Payer: 59 | Attending: Emergency Medicine | Admitting: Emergency Medicine

## 2021-03-08 ENCOUNTER — Encounter: Payer: Self-pay | Admitting: Emergency Medicine

## 2021-03-08 DIAGNOSIS — Z1152 Encounter for screening for COVID-19: Secondary | ICD-10-CM | POA: Diagnosis not present

## 2021-03-08 DIAGNOSIS — R6889 Other general symptoms and signs: Secondary | ICD-10-CM

## 2021-03-08 MED ORDER — ONDANSETRON HCL 4 MG PO TABS: 4.0000 mg | ORAL_TABLET | Freq: Four times a day (QID) | ORAL | 0 refills | Status: DC

## 2021-03-08 NOTE — ED Provider Notes (Signed)
[place]   [id number] 03/08/21 Arrival Time: 62   CC: COVID symptoms  SUBJECTIVE: History from: patient.  Julia Wang is a 60 y.o. female who presents with fever with tmax of 102.5, cough, and body aches.  Admits to positive covid exposure to husband.  Has tried OTC medications without relief.  Denies aggravating factors. Reports previous covid infection in the past.   Denies sinus pain, rhinorrhea, sore throat, SOB, wheezing, chest pain, nausea, changes in bowel or bladder habits.    ROS: As per HPI.  All other pertinent ROS negative.     Past Medical History:  Diagnosis Date   Complex cyst of left ovary    Diabetes mellitus without complication ([place])    Melanoma ([place])    Pre-diabetes    SVT (supraventricular tachycardia) ([place])    Past Surgical History:  Procedure Laterality Date   CESAREAN SECTION  1986   CHOLECYSTECTOMY     COLONOSCOPY N/A 05/02/2015   Procedure: COLONOSCOPY;  Surgeon: Rogene Houston, MD;  [facility];  Service: Endoscopy;  Laterality: N/A;  1030   Allergies  Allergen Reactions   Daypro [Oxaprozin]     hives   No current facility-administered medications on file prior to encounter.   Current Outpatient Medications on File Prior to Encounter  Medication Sig Dispense Refill   ALPRAZolam (XANAX) 1 MG tablet TK 1 T PO TID PRF ANXIETY     aspirin EC 81 MG tablet Take 81 mg by mouth daily.     CRANBERRY PO Take by mouth every morning.     diclofenac (VOLTAREN) 75 MG EC tablet TAKE 1 TABLET BY MOUTH TWICE DAILY FOR 2 WEEKS THEN AS NEEDED FOR INFLAMMATION     fluticasone (FLONASE) 50 MCG/ACT nasal spray SPRAY ONCE INTO EACH NOSTRIL ONCE DAILY     metFORMIN (GLUCOPHAGE) 500 MG tablet TK 1 T PO BID  1   metoprolol succinate (TOPROL-XL) 25 MG 24 hr tablet Take 25 mg by mouth daily.     metoprolol tartrate (LOPRESSOR) 50 MG tablet Take 50 mg by mouth daily as needed.     Multiple Vitamins-Minerals (HAIR SKIN AND NAILS FORMULA PO)  Take by mouth.     Omega-3 Fatty Acids (FISH OIL PO) Take by mouth.     pravastatin (PRAVACHOL) 20 MG tablet Take by mouth.     sertraline (ZOLOFT) 50 MG tablet Take 75 mg by mouth daily.      sulfamethoxazole-trimethoprim (BACTRIM DS) 800-160 MG tablet Take 1 tablet by mouth 2 (two) times daily. 14 tablet 0   tretinoin (RETIN-A) 0.025 % cream Apply topically at bedtime.     VITAMIN D PO Take by mouth.     Social History   Socioeconomic History   Marital status: Married    Spouse name: Not on file   Number of children: Not on file   Years of education: Not on file   Highest education level: Not on file  Occupational History   Not on file  Tobacco Use   Smoking status: Never   Smokeless tobacco: Never  Vaping Use   Vaping Use: Never used  Substance and Sexual Activity   Alcohol use: No   Drug use: No   Sexual activity: Yes    Birth control/protection: Surgical    Comment: vasectomy  Other Topics Concern   Not on file  Social History Narrative   Not on file   Social Determinants of Health   Financial Resource Strain: Low  Risk    Difficulty of Paying Living Expenses: Not hard at all  Food Insecurity: No Food Insecurity   Worried About [place] in the Last Year: Never true   Ran Out of Food in the Last Year: Never true  Transportation Needs: No Transportation Needs   Lack of Transportation (Medical): No   Lack of Transportation (Non-Medical): No  Physical Activity: Insufficiently Active   Days of Exercise per Week: 2 days   Minutes of Exercise per Session: 60 min  Stress: No Stress Concern Present   Feeling of Stress : Only a little  Social Connections: Engineer, building services of Communication with Friends and Family: More than three times a week   Frequency of Social Gatherings with Friends and Family: More than three times a week   Attends Religious Services: More than 4 times per year   Active Member of [employer] or Organizations: Yes   Attends Arts development officer: More than 4 times per year   Marital Status: Married  Human resources officer Violence: Not At Risk   Fear of Current or Ex-Partner: No   Emotionally Abused: No   Physically Abused: No   Sexually Abused: No   Family History  Problem Relation Age of Onset   Cancer Mother    Heart failure Father    Breast cancer Sister    Cancer Brother        lung   Alcoholism Brother     OBJECTIVE:  Vitals:   03/08/21 1250  BP: 101/66  Pulse: 93  Resp: 18  Temp: 98.4 F (36.9 C)  TempSrc: Oral  SpO2: 94%     General appearance: alert; well appearing, nontoxic; speaking in full sentences and tolerating own secretions HEENT: NCAT; Ears: EACs clear, TMs pearly gray; Eyes: PERRL.  EOM grossly intact. Sinuses: nontender; Nose: nares patent without rhinorrhea, Throat: oropharynx clear, tonsils non erythematous or enlarged, uvula midline  Neck: supple without LAD Lungs: unlabored respirations, symmetrical air entry; cough: absent; no respiratory distress; CTAB Heart: regular rate and rhythm.   Skin: warm and dry Psychological: alert and cooperative; normal mood and affect  ASSESSMENT & PLAN:  1. Encounter for screening for COVID-19   2. Flu-like symptoms     Meds ordered this encounter  Medications   ondansetron (ZOFRAN) 4 MG tablet    Sig: Take 1 tablet (4 mg total) by mouth every 6 (six) hours.    Dispense:  12 tablet    Refill:  0    Order Specific Question:   Supervising Provider    Answer:   Raylene Everts [[id number]]    COVID testing ordered.  It will take between 2-3 days for test results.  Someone will contact you regarding abnormal results.    In the meantime: You should remain isolated in your home for 5 days from symptom onset AND greater than 72 hours after symptoms resolution (absence of fever without the use of fever-reducing medication and improvement in respiratory symptoms), whichever is longer Get plenty of rest and push fluids Tessalon  Perles prescribed for cough Use OTC zyrtec for nasal congestion, runny nose, and/or sore throat Use OTC flonase for nasal congestion and runny nose Use medications daily for symptom relief Use OTC medications like ibuprofen or tylenol as needed fever or pain Call or go to the ED if you have any new or worsening symptoms such as fever, cough, shortness of breath, chest tightness, chest pain, turning blue, changes in mental status,  etc...   Zofran as needed for nausea and or vomiting  Reviewed expectations re: course of current medical issues. Questions answered. Outlined signs and symptoms indicating need for more acute intervention. Patient verbalized understanding. After Visit Summary given.          Lestine Box, PA-C 03/08/21 1305

## 2021-03-08 NOTE — ED Triage Notes (Signed)
Pt said she woke up this am with fevers of 102.5 with cough and body aches. Pt said husband is covid +

## 2021-03-08 NOTE — Discharge Instructions (Signed)
COVID testing ordered.  It will take between 2-3 days for test results.  Someone will contact you regarding abnormal results.    In the meantime: You should remain isolated in your home for 5 days from symptom onset AND greater than 72 hours after symptoms resolution (absence of fever without the use of fever-reducing medication and improvement in respiratory symptoms), whichever is longer Get plenty of rest and push fluids Tessalon Perles prescribed for cough Use OTC zyrtec for nasal congestion, runny nose, and/or sore throat Use OTC flonase for nasal congestion and runny nose Use medications daily for symptom relief Use OTC medications like ibuprofen or tylenol as needed fever or pain Call or go to the ED if you have any new or worsening symptoms such as fever, cough, shortness of breath, chest tightness, chest pain, turning blue, changes in mental status, etc..

## 2021-03-09 LAB — NOVEL CORONAVIRUS, NAA: SARS-CoV-2, NAA: NOT DETECTED

## 2021-03-09 LAB — SARS-COV-2, NAA 2 DAY TAT

## 2021-03-10 ENCOUNTER — Telehealth: Payer: Self-pay | Admitting: Emergency Medicine

## 2021-03-10 NOTE — Telephone Encounter (Signed)
Spoke to pt she sts she is still having sx; requesting antibiotic; informed that there was no note from provider indicating ability to call in antibiotic; retold OTC meds to take and follow up if needed

## 2021-03-11 ENCOUNTER — Encounter: Payer: Self-pay | Admitting: Emergency Medicine

## 2021-03-11 ENCOUNTER — Ambulatory Visit
Admission: EM | Admit: 2021-03-11 | Discharge: 2021-03-11 | Disposition: A | Discharge status: Home / Self Care | Payer: 59 | Attending: Physician Assistant | Admitting: Physician Assistant

## 2021-03-11 ENCOUNTER — Other Ambulatory Visit: Payer: Self-pay

## 2021-03-11 DIAGNOSIS — J0120 Acute ethmoidal sinusitis, unspecified: Secondary | ICD-10-CM | POA: Diagnosis not present

## 2021-03-11 MED ORDER — AZITHROMYCIN 250 MG PO TABS: ORAL_TABLET | ORAL | 0 refills | Status: AC

## 2021-03-11 MED ORDER — PREDNISONE 50 MG PO TABS: ORAL_TABLET | ORAL | 0 refills | Status: DC

## 2021-03-11 NOTE — Discharge Instructions (Addendum)
Return if any problems.

## 2021-03-11 NOTE — ED Provider Notes (Signed)
[facility]    CSN: [id number] Arrival date & time: 03/11/21  1343      History   Chief Complaint No chief complaint on file.   HPI Julia Wang is a 60 y.o. female.   Pt thinks she may have had covid.  Husband tested positive 2 weeks ago.  Pt complains of persistent drainage, facial pain and left ear pain.   Pt requesting antibiotics and prednisone.    The history is provided by the patient. No language interpreter was used.  Cough Cough characteristics:  Non-productive Sputum characteristics:  Nondescript Severity:  Moderate Onset quality:  Gradual Duration:  1 week Timing:  Constant Progression:  Worsening Chronicity:  New Relieved by:  Nothing Worsened by:  Nothing Ineffective treatments:  None tried Associated symptoms: sinus congestion    Past Medical History:  Diagnosis Date   Complex cyst of left ovary    Diabetes mellitus without complication ([place])    Melanoma ([place])    Pre-diabetes    SVT (supraventricular tachycardia) ([place])     Patient Active Problem List   Diagnosis Date Noted   Encounter for screening fecal occult blood testing 09/10/2020   Cyst of left ovary 08/22/2019   Body aches 08/22/2019   Vaginal dryness 08/01/2019   Dyspareunia in female 08/01/2019   Tenderness of female pelvic organs 08/01/2019   Screening for colorectal cancer 08/01/2019   Encounter for well woman exam with routine gynecological exam 08/01/2019   Boil 08/01/2019   Type 2 diabetes mellitus ([place]) 05/02/2013   Urethral diverticulum 05/02/2013    Past Surgical History:  Procedure Laterality Date   CESAREAN SECTION  1986   CHOLECYSTECTOMY     COLONOSCOPY N/A 05/02/2015   Procedure: COLONOSCOPY;  Surgeon: Rogene Houston, MD;  [facility];  Service: Endoscopy;  Laterality: N/A;  75    OB History     Gravida  2   Para  1   Term  1   Preterm      AB  1   Living  1      SAB  1   IAB      Ectopic      Multiple      Live  Births  1            Home Medications    Prior to Admission medications   Medication Sig Start Date End Date Taking? Authorizing Provider  azithromycin (ZITHROMAX Z-PAK) 250 MG tablet Take 2 tablets (500 mg) on  Day 1,  followed by 1 tablet (250 mg) once daily on Days 2 through 5. 03/11/21 03/16/21 Yes Fransico Meadow, PA-C  predniSONE (DELTASONE) 50 MG tablet Take one tablet a day 03/11/21  Yes Willey Due K, PA-C  ALPRAZolam Duanne Moron) 1 MG tablet TK 1 T PO TID PRF ANXIETY 03/15/19   [provider]  aspirin EC 81 MG tablet Take 81 mg by mouth daily.    [provider]  CRANBERRY PO Take by mouth every morning.    [provider]  diclofenac (VOLTAREN) 75 MG EC tablet TAKE 1 TABLET BY MOUTH TWICE DAILY FOR 2 WEEKS THEN AS NEEDED FOR INFLAMMATION 06/27/19   [provider]  fluticasone (FLONASE) 50 MCG/ACT nasal spray SPRAY ONCE INTO EACH NOSTRIL ONCE DAILY 08/07/19   [provider]  metFORMIN (GLUCOPHAGE) 500 MG tablet TK 1 T PO BID 08/04/17   [provider]  metoprolol succinate (TOPROL-XL) 25 MG 24 hr tablet Take  25 mg by mouth daily. 03/06/19   [provider]  metoprolol tartrate (LOPRESSOR) 50 MG tablet Take 50 mg by mouth daily as needed.    [provider]  Multiple Vitamins-Minerals (HAIR SKIN AND NAILS FORMULA PO) Take by mouth.    [provider]  Omega-3 Fatty Acids (FISH OIL PO) Take by mouth.    [provider]  ondansetron (ZOFRAN) 4 MG tablet Take 1 tablet (4 mg total) by mouth every 6 (six) hours. 03/08/21   Wurst, [place], PA-C  pravastatin (PRAVACHOL) 20 MG tablet Take by mouth. 07/30/20   [provider]  sertraline (ZOLOFT) 50 MG tablet Take 75 mg by mouth daily.     [provider]  sulfamethoxazole-trimethoprim (BACTRIM DS) 800-160 MG tablet Take 1 tablet by mouth 2 (two) times daily. 10/10/20   Florian Buff, MD  tretinoin (RETIN-A) 0.025 % cream Apply topically at  bedtime. 09/17/20   [provider]  VITAMIN D PO Take by mouth.    [provider]    Family History Family History  Problem Relation Age of Onset   Cancer Mother    Heart failure Father    Breast cancer Sister    Cancer Brother        lung   Alcoholism Brother     Social History Social History   Tobacco Use   Smoking status: Never   Smokeless tobacco: Never  Vaping Use   Vaping Use: Never used  Substance Use Topics   Alcohol use: No   Drug use: No     Allergies   Daypro [oxaprozin]   Review of Systems Review of Systems  HENT:  Positive for sinus pain.   Respiratory:  Positive for cough.   All other systems reviewed and are negative.   Physical Exam Triage Vital Signs ED Triage Vitals  Enc Vitals Group     BP 03/11/21 1517 126/78     Pulse Rate 03/11/21 1517 78     Resp 03/11/21 1517 18     Temp 03/11/21 1517 97.7 F (36.5 C)     Temp Source 03/11/21 1517 Oral     SpO2 03/11/21 1517 95 %     Weight --      Height --      Head Circumference --      Peak Flow --      Pain Score 03/11/21 1516 5     Pain Loc --      Pain Edu? --      Excl. in [place]? --    No data found.  Updated Vital Signs BP 126/78 (BP Location: Right Arm)   Pulse 78   Temp 97.7 F (36.5 C) (Oral)   Resp 18   LMP 12/24/2017   SpO2 95%   Visual Acuity Right Eye Distance:   Left Eye Distance:   Bilateral Distance:    Right Eye Near:   Left Eye Near:    Bilateral Near:     Physical Exam Vitals and nursing note reviewed.  Constitutional:      Appearance: She is well-developed.  HENT:     Head: Normocephalic.     Right Ear: Tympanic membrane normal.     Left Ear: Tympanic membrane normal.     Nose: Congestion present.     Mouth/Throat:     Mouth: Mucous membranes are moist.  Cardiovascular:     Rate and Rhythm: Normal rate.  Pulmonary:     Effort: Pulmonary effort  is normal.  Abdominal:     General: There is no distension.  Musculoskeletal:         General: Normal range of motion.     Cervical back: Normal range of motion.  Neurological:     Mental Status: She is alert and oriented to person, place, and time.     UC Treatments / Results  Labs (all labs ordered are listed, but only abnormal results are displayed) Labs Reviewed - No data to display  EKG   Radiology No results found.  Procedures Procedures (including critical care time)  Medications Ordered in UC Medications - No data to display  Initial Impression / Assessment and Plan / UC Course  I have reviewed the triage vital signs and the nursing notes.  Pertinent labs & imaging results that were available during my care of the patient were reviewed by me and considered in my medical decision making (see chart for details).     MDM:  I counseled pt on provability symptoms are viral post viral. Pt wants to precede with antibiotics.  Pt request a 5 day antibiotic.  Final Clinical Impressions(s) / UC Diagnoses   Final diagnoses:  Acute ethmoidal sinusitis, recurrence not specified     Discharge Instructions      Return if any problems.    ED Prescriptions     Medication Sig Dispense Auth. Provider   azithromycin (ZITHROMAX Z-PAK) 250 MG tablet Take 2 tablets (500 mg) on  Day 1,  followed by 1 tablet (250 mg) once daily on Days 2 through 5. 6 each Josefina Rynders K, PA-C   predniSONE (DELTASONE) 50 MG tablet Take one tablet a day 5 tablet Fransico Meadow, [place]      PDMP not reviewed this encounter. An After Visit Summary was printed and given to the patient.    Fransico Meadow, [place] 03/11/21 4709

## 2021-03-11 NOTE — ED Triage Notes (Signed)
Nasal congestion, was seen on Saturday.  Had a negative covid test.  States her teeth and cheek bones hurt.  States left ear hurts.

## 2021-06-20 ENCOUNTER — Other Ambulatory Visit (HOSPITAL_COMMUNITY): Payer: Self-pay | Admitting: Internal Medicine

## 2021-06-20 DIAGNOSIS — Z1231 Encounter for screening mammogram for malignant neoplasm of breast: Secondary | ICD-10-CM

## 2021-06-27 ENCOUNTER — Ambulatory Visit (HOSPITAL_COMMUNITY): Payer: Self-pay

## 2021-07-04 ENCOUNTER — Other Ambulatory Visit: Payer: Self-pay

## 2021-07-04 ENCOUNTER — Ambulatory Visit (HOSPITAL_COMMUNITY)
Admission: RE | Admit: 2021-07-04 | Discharge: 2021-07-04 | Disposition: A | Discharge status: Home / Self Care | Payer: 59 | Source: Ambulatory Visit | Attending: Internal Medicine | Admitting: Internal Medicine

## 2021-07-04 DIAGNOSIS — Z1231 Encounter for screening mammogram for malignant neoplasm of breast: Secondary | ICD-10-CM | POA: Diagnosis present

## 2022-04-22 DIAGNOSIS — Z791 Long term (current) use of non-steroidal anti-inflammatories (NSAID): Secondary | ICD-10-CM | POA: Diagnosis not present

## 2022-04-22 DIAGNOSIS — B369 Superficial mycosis, unspecified: Secondary | ICD-10-CM | POA: Diagnosis not present

## 2022-04-22 DIAGNOSIS — R69 Illness, unspecified: Secondary | ICD-10-CM | POA: Diagnosis not present

## 2022-04-22 DIAGNOSIS — E119 Type 2 diabetes mellitus without complications: Secondary | ICD-10-CM | POA: Diagnosis not present

## 2022-04-22 DIAGNOSIS — Z7982 Long term (current) use of aspirin: Secondary | ICD-10-CM | POA: Diagnosis not present

## 2022-04-22 DIAGNOSIS — Z6830 Body mass index (BMI) 30.0-30.9, adult: Secondary | ICD-10-CM | POA: Diagnosis not present

## 2022-04-22 DIAGNOSIS — I471 Supraventricular tachycardia, unspecified: Secondary | ICD-10-CM | POA: Diagnosis not present

## 2022-04-22 DIAGNOSIS — E669 Obesity, unspecified: Secondary | ICD-10-CM | POA: Diagnosis not present

## 2022-04-22 DIAGNOSIS — L905 Scar conditions and fibrosis of skin: Secondary | ICD-10-CM | POA: Diagnosis not present

## 2022-04-22 DIAGNOSIS — R030 Elevated blood-pressure reading, without diagnosis of hypertension: Secondary | ICD-10-CM | POA: Diagnosis not present

## 2022-04-22 DIAGNOSIS — E785 Hyperlipidemia, unspecified: Secondary | ICD-10-CM | POA: Diagnosis not present

## 2022-04-22 DIAGNOSIS — M1990 Unspecified osteoarthritis, unspecified site: Secondary | ICD-10-CM | POA: Diagnosis not present

## 2022-05-01 DIAGNOSIS — E782 Mixed hyperlipidemia: Secondary | ICD-10-CM | POA: Diagnosis not present

## 2022-05-04 DIAGNOSIS — Z23 Encounter for immunization: Secondary | ICD-10-CM | POA: Diagnosis not present

## 2022-05-04 DIAGNOSIS — K760 Fatty (change of) liver, not elsewhere classified: Secondary | ICD-10-CM | POA: Diagnosis not present

## 2022-05-04 DIAGNOSIS — R69 Illness, unspecified: Secondary | ICD-10-CM | POA: Diagnosis not present

## 2022-05-04 DIAGNOSIS — I471 Supraventricular tachycardia, unspecified: Secondary | ICD-10-CM | POA: Diagnosis not present

## 2022-05-04 DIAGNOSIS — J302 Other seasonal allergic rhinitis: Secondary | ICD-10-CM | POA: Diagnosis not present

## 2022-05-04 DIAGNOSIS — Z0000 Encounter for general adult medical examination without abnormal findings: Secondary | ICD-10-CM | POA: Diagnosis not present

## 2022-05-04 DIAGNOSIS — E1165 Type 2 diabetes mellitus with hyperglycemia: Secondary | ICD-10-CM | POA: Diagnosis not present

## 2022-05-04 DIAGNOSIS — Z79890 Hormone replacement therapy (postmenopausal): Secondary | ICD-10-CM | POA: Diagnosis not present

## 2022-05-04 DIAGNOSIS — E782 Mixed hyperlipidemia: Secondary | ICD-10-CM | POA: Diagnosis not present

## 2022-05-04 DIAGNOSIS — E538 Deficiency of other specified B group vitamins: Secondary | ICD-10-CM | POA: Diagnosis not present

## 2022-05-04 DIAGNOSIS — E559 Vitamin D deficiency, unspecified: Secondary | ICD-10-CM | POA: Diagnosis not present

## 2022-05-04 DIAGNOSIS — M79672 Pain in left foot: Secondary | ICD-10-CM | POA: Diagnosis not present

## 2022-06-18 ENCOUNTER — Other Ambulatory Visit (HOSPITAL_COMMUNITY): Payer: Self-pay | Admitting: Obstetrics & Gynecology

## 2022-06-18 DIAGNOSIS — Z1231 Encounter for screening mammogram for malignant neoplasm of breast: Secondary | ICD-10-CM

## 2022-06-19 DIAGNOSIS — Z79899 Other long term (current) drug therapy: Secondary | ICD-10-CM | POA: Diagnosis not present

## 2022-06-19 DIAGNOSIS — I471 Supraventricular tachycardia, unspecified: Secondary | ICD-10-CM | POA: Diagnosis not present

## 2022-07-09 ENCOUNTER — Ambulatory Visit (HOSPITAL_COMMUNITY)
Admission: RE | Admit: 2022-07-09 | Discharge: 2022-07-09 | Disposition: A | Discharge status: Home / Self Care | Payer: 59 | Source: Ambulatory Visit | Attending: Obstetrics & Gynecology | Admitting: Obstetrics & Gynecology

## 2022-07-09 DIAGNOSIS — Z1231 Encounter for screening mammogram for malignant neoplasm of breast: Secondary | ICD-10-CM

## 2022-08-02 ENCOUNTER — Ambulatory Visit
Admission: EM | Admit: 2022-08-02 | Discharge: 2022-08-02 | Disposition: A | Discharge status: Home / Self Care | Payer: 59 | Attending: Nurse Practitioner | Admitting: Nurse Practitioner

## 2022-08-02 DIAGNOSIS — N12 Tubulo-interstitial nephritis, not specified as acute or chronic: Secondary | ICD-10-CM | POA: Diagnosis not present

## 2022-08-02 LAB — POCT URINALYSIS DIP (MANUAL ENTRY)
Bilirubin, UA: NEGATIVE
Glucose, UA: NEGATIVE mg/dL
Ketones, POC UA: NEGATIVE mg/dL
Nitrite, UA: POSITIVE — AB
Protein Ur, POC: 100 mg/dL — AB
Spec Grav, UA: <=1.005 — AB (ref 1.010–1.025)
Urobilinogen, UA: 0.2 E.U./dL
pH, UA: 5 (ref 5.0–8.0)

## 2022-08-02 MED ORDER — CIPROFLOXACIN HCL 500 MG PO TABS: 500.0000 mg | ORAL_TABLET | Freq: Two times a day (BID) | ORAL | 0 refills | Status: DC

## 2022-08-02 NOTE — ED Provider Notes (Signed)
[facility]    CSN: [id number] Arrival date & time: 08/02/22  0950      History   Chief Complaint Chief Complaint  Patient presents with   Dysuria         HPI Julia Wang is a 62 y.o. female.   The history is provided by the patient.   Patient presents with a 4-day history of burning with urination, urinary urgency, frequency, hesitancy, lower abdominal pain, and low back pain.  She also states that her urine has had a foul smell to it and has been cloudy.  She denies fever, chills, chest pain, nausea, vomiting, or diarrhea.  Patient reports that she had sexual intercourse prior to her symptoms starting.  Patient denies history of recurrent urinary tract infections.  She states when she had her last UTI, she did take a sulfa medication, but it did nothing for her.  She has been taking Azo for her symptoms and drinking plenty of water with minimal relief. Past Medical History:  Diagnosis Date   Complex cyst of left ovary    Diabetes mellitus without complication ([place])    Melanoma ([place])    Pre-diabetes    SVT (supraventricular tachycardia)     Patient Active Problem List   Diagnosis Date Noted   Encounter for screening fecal occult blood testing 09/10/2020   Cyst of left ovary 08/22/2019   Body aches 08/22/2019   Vaginal dryness 08/01/2019   Dyspareunia in female 08/01/2019   Tenderness of female pelvic organs 08/01/2019   Screening for colorectal cancer 08/01/2019   Encounter for well woman exam with routine gynecological exam 08/01/2019   Boil 08/01/2019   Type 2 diabetes mellitus ([place]) 05/02/2013   Urethral diverticulum 05/02/2013    Past Surgical History:  Procedure Laterality Date   [place]     COLONOSCOPY N/A 05/02/2015   Procedure: COLONOSCOPY;  Surgeon: Rogene Houston, MD;  [facility];  Service: Endoscopy;  Laterality: N/A;  66    OB History     Gravida  2   Para  1   Term  1   Preterm       AB  1   Living  1      SAB  1   IAB      Ectopic      Multiple      Live Births  1            Home Medications    Prior to Admission medications   Medication Sig Start Date End Date Taking? Authorizing Provider  ciprofloxacin (CIPRO) 500 MG tablet Take 1 tablet (500 mg total) by mouth 2 (two) times daily. 08/02/22  Yes Idamay Hosein-Warren, Alda Lea, NP  phenazopyridine (PYRIDIUM) 95 MG tablet Take 95 mg by mouth 3 (three) times daily as needed for pain.   Yes [provider]  ALPRAZolam Duanne Moron) 1 MG tablet TK 1 T PO TID PRF ANXIETY 03/15/19   [provider]  aspirin EC 81 MG tablet Take 81 mg by mouth daily.    [provider]  CRANBERRY PO Take by mouth every morning.    [provider]  diclofenac (VOLTAREN) 75 MG EC tablet TAKE 1 TABLET BY MOUTH TWICE DAILY FOR 2 WEEKS THEN AS NEEDED FOR INFLAMMATION 06/27/19   [provider]  fluticasone (FLONASE) 50 MCG/ACT nasal spray SPRAY ONCE INTO EACH NOSTRIL ONCE DAILY 08/07/19   [provider]  metFORMIN (  GLUCOPHAGE) 500 MG tablet TK 1 T PO BID 08/04/17   [provider]  metoprolol succinate (TOPROL-XL) 25 MG 24 hr tablet Take 25 mg by mouth daily. 03/06/19   [provider]  metoprolol tartrate (LOPRESSOR) 50 MG tablet Take 50 mg by mouth daily as needed.    [provider]  Multiple Vitamins-Minerals (HAIR SKIN AND NAILS FORMULA PO) Take by mouth.    [provider]  Omega-3 Fatty Acids (FISH OIL PO) Take by mouth.    [provider]  ondansetron (ZOFRAN) 4 MG tablet Take 1 tablet (4 mg total) by mouth every 6 (six) hours. 03/08/21   Wurst, [place], PA-C  pravastatin (PRAVACHOL) 20 MG tablet Take by mouth. 07/30/20   [provider]  predniSONE (DELTASONE) 50 MG tablet Take one tablet a day 03/11/21   Fransico Meadow, PA-C  sertraline (ZOLOFT) 50 MG tablet Take 75 mg by mouth daily.     [provider]   sulfamethoxazole-trimethoprim (BACTRIM DS) 800-160 MG tablet Take 1 tablet by mouth 2 (two) times daily. 10/10/20   Florian Buff, MD  tretinoin (RETIN-A) 0.025 % cream Apply topically at bedtime. 09/17/20   [provider]  VITAMIN D PO Take by mouth.    [provider]    Family History Family History  Problem Relation Age of Onset   Cancer Mother    Heart failure Father    Breast cancer Sister    Cancer Brother        lung   Alcoholism Brother     Social History Social History   Tobacco Use   Smoking status: Never   Smokeless tobacco: Never  Vaping Use   Vaping Use: Never used  Substance Use Topics   Alcohol use: No   Drug use: No     Allergies   Daypro [oxaprozin]   Review of Systems Review of Systems Per HPI  Physical Exam Triage Vital Signs ED Triage Vitals  Enc Vitals Group     BP 08/02/22 1137 (!) 137/92     Pulse Rate 08/02/22 1137 82     Resp 08/02/22 1137 16     Temp 08/02/22 1137 98.2 F (36.8 C)     Temp Source 08/02/22 1137 Oral     SpO2 08/02/22 1137 94 %     Weight --      Height --      Head Circumference --      Peak Flow --      Pain Score 08/02/22 1138 6     Pain Loc --      Pain Edu? --      Excl. in [place]? --    No data found.  Updated Vital Signs BP (!) 137/92 (BP Location: Right Arm)   Pulse 82   Temp 98.2 F (36.8 C) (Oral)   Resp 16   LMP 12/24/2017   SpO2 94%   Visual Acuity Right Eye Distance:   Left Eye Distance:   Bilateral Distance:    Right Eye Near:   Left Eye Near:    Bilateral Near:     Physical Exam Vitals and nursing note reviewed.  Constitutional:      General: She is not in acute distress.    Appearance: Normal appearance.  HENT:     Head: Normocephalic.  Eyes:     Extraocular Movements: Extraocular movements intact.     Pupils: Pupils are equal, round, and reactive to light.  Cardiovascular:  Rate and Rhythm: Regular rhythm.     Pulses: Normal pulses.     Heart sounds:  Normal heart sounds.  Pulmonary:     Effort: Pulmonary effort is normal.     Breath sounds: Normal breath sounds.  Abdominal:     General: Bowel sounds are normal.     Palpations: Abdomen is soft.     Tenderness: There is abdominal tenderness in the suprapubic area. There is right CVA tenderness. There is no left CVA tenderness.  Musculoskeletal:     Cervical back: Normal range of motion.  Lymphadenopathy:     Cervical: No cervical adenopathy.  Skin:    General: Skin is warm and dry.  Neurological:     General: No focal deficit present.     Mental Status: She is alert and oriented to person, place, and time.  Psychiatric:        Mood and Affect: Mood normal.        Behavior: Behavior normal.      UC Treatments / Results  Labs (all labs ordered are listed, but only abnormal results are displayed) Labs Reviewed  POCT URINALYSIS DIP (MANUAL ENTRY) - Abnormal; Notable for the following components:      Result Value   Clarity, UA cloudy (*)    Spec Grav, UA <=1.005 (*)    Blood, UA large (*)    Protein Ur, POC =100 (*)    Nitrite, UA Positive (*)    Leukocytes, UA Small (1+) (*)    All other components within normal limits  URINE CULTURE    EKG   Radiology No results found.  Procedures Procedures (including critical care time)  Medications Ordered in UC Medications - No data to display  Initial Impression / Assessment and Plan / UC Course  I have reviewed the triage vital signs and the nursing notes.  Pertinent labs & imaging results that were available during my care of the patient were reviewed by me and considered in my medical decision making (see chart for details).  The patient is well-appearing, she is in no acute distress, vital signs are stable.  Urinalysis is positive for nitrates and leukocytes.  Urine culture is pending.  Patient has right CVA tenderness.  Will treat for pyelonephritis. Patient reports she has taken Bactrim with no relief of symptoms,  will start patient on Cipro 500 mg twice daily for the next 7 days.  Supportive care recommendations were provided to the patient to include increasing fluids, developing a toileting schedule, and over-the-counter analgesics for pain or discomfort.  Discussed strict indications of when follow-up in the emergency department would be necessary.  Patient is in agreement with this plan of care and verbalizes understanding.  All questions were answered.  Patient stable for discharge.  Final Clinical Impressions(s) / UC Diagnoses   Final diagnoses:  Pyelonephritis     Discharge Instructions      -Take medications as prescribed. -Increase fluids. -Ibuprofen or Tylenol for pain, fever, or general discomfort. -Develop a toileting schedule that will allow you to toilet at least every 2 hours. -Avoid caffeine to include tea, soda, and coffee. -If sexually active, void at least 15 to 20 minutes after sexual intercourse. -Follow-up in the emergency department if you develop fever, chills, worsening abdominal pain, or other concerns while taking medication. Follow-up as needed.     ED Prescriptions     Medication Sig Dispense Auth. Provider   ciprofloxacin (CIPRO) 500 MG tablet Take 1 tablet (500 mg total)  by mouth 2 (two) times daily. 14 tablet Joseandres Mazer-Warren, Alda Lea, NP      PDMP not reviewed this encounter.   Tish Men, NP 08/02/22 [phone]

## 2022-08-02 NOTE — ED Triage Notes (Signed)
Pt reports burning when urinating, lower abdominal pain, low back pain, cloudy urine x 4 days. AZO gives no relief.

## 2022-08-02 NOTE — Discharge Instructions (Signed)
-  Take medications as prescribed. -Increase fluids. -Ibuprofen or Tylenol for pain, fever, or general discomfort. -Develop a toileting schedule that will allow you to toilet at least every 2 hours. -Avoid caffeine to include tea, soda, and coffee. -If sexually active, void at least 15 to 20 minutes after sexual intercourse. -Follow-up in the emergency department if you develop fever, chills, worsening abdominal pain, or other concerns while taking medication. Follow-up as needed.

## 2022-08-03 DIAGNOSIS — E782 Mixed hyperlipidemia: Secondary | ICD-10-CM | POA: Diagnosis not present

## 2022-08-04 LAB — URINE CULTURE: Culture: >=100000 — AB

## 2022-08-07 DIAGNOSIS — I471 Supraventricular tachycardia, unspecified: Secondary | ICD-10-CM | POA: Diagnosis not present

## 2022-08-07 DIAGNOSIS — R69 Illness, unspecified: Secondary | ICD-10-CM | POA: Diagnosis not present

## 2022-08-07 DIAGNOSIS — E1165 Type 2 diabetes mellitus with hyperglycemia: Secondary | ICD-10-CM | POA: Diagnosis not present

## 2022-08-07 DIAGNOSIS — N390 Urinary tract infection, site not specified: Secondary | ICD-10-CM | POA: Diagnosis not present

## 2022-08-07 DIAGNOSIS — K760 Fatty (change of) liver, not elsewhere classified: Secondary | ICD-10-CM | POA: Diagnosis not present

## 2022-08-07 DIAGNOSIS — E538 Deficiency of other specified B group vitamins: Secondary | ICD-10-CM | POA: Diagnosis not present

## 2022-08-07 DIAGNOSIS — E559 Vitamin D deficiency, unspecified: Secondary | ICD-10-CM | POA: Diagnosis not present

## 2022-08-07 DIAGNOSIS — J302 Other seasonal allergic rhinitis: Secondary | ICD-10-CM | POA: Diagnosis not present

## 2022-08-07 DIAGNOSIS — Z0000 Encounter for general adult medical examination without abnormal findings: Secondary | ICD-10-CM | POA: Diagnosis not present

## 2022-08-07 DIAGNOSIS — M79672 Pain in left foot: Secondary | ICD-10-CM | POA: Diagnosis not present

## 2022-08-07 DIAGNOSIS — E782 Mixed hyperlipidemia: Secondary | ICD-10-CM | POA: Diagnosis not present

## 2022-08-07 DIAGNOSIS — Z79890 Hormone replacement therapy (postmenopausal): Secondary | ICD-10-CM | POA: Diagnosis not present

## 2022-11-27 DIAGNOSIS — E782 Mixed hyperlipidemia: Secondary | ICD-10-CM | POA: Diagnosis not present

## 2022-11-27 DIAGNOSIS — E1165 Type 2 diabetes mellitus with hyperglycemia: Secondary | ICD-10-CM | POA: Diagnosis not present

## 2022-12-03 DIAGNOSIS — Z79890 Hormone replacement therapy (postmenopausal): Secondary | ICD-10-CM | POA: Diagnosis not present

## 2022-12-03 DIAGNOSIS — E782 Mixed hyperlipidemia: Secondary | ICD-10-CM | POA: Diagnosis not present

## 2022-12-03 DIAGNOSIS — I471 Supraventricular tachycardia, unspecified: Secondary | ICD-10-CM | POA: Diagnosis not present

## 2022-12-03 DIAGNOSIS — E559 Vitamin D deficiency, unspecified: Secondary | ICD-10-CM | POA: Diagnosis not present

## 2022-12-03 DIAGNOSIS — E1165 Type 2 diabetes mellitus with hyperglycemia: Secondary | ICD-10-CM | POA: Diagnosis not present

## 2022-12-03 DIAGNOSIS — K580 Irritable bowel syndrome with diarrhea: Secondary | ICD-10-CM | POA: Diagnosis not present

## 2022-12-03 DIAGNOSIS — M79672 Pain in left foot: Secondary | ICD-10-CM | POA: Diagnosis not present

## 2022-12-03 DIAGNOSIS — E538 Deficiency of other specified B group vitamins: Secondary | ICD-10-CM | POA: Diagnosis not present

## 2022-12-03 DIAGNOSIS — F411 Generalized anxiety disorder: Secondary | ICD-10-CM | POA: Diagnosis not present

## 2022-12-03 DIAGNOSIS — J302 Other seasonal allergic rhinitis: Secondary | ICD-10-CM | POA: Diagnosis not present

## 2022-12-03 DIAGNOSIS — Z79899 Other long term (current) drug therapy: Secondary | ICD-10-CM | POA: Diagnosis not present

## 2022-12-03 DIAGNOSIS — K760 Fatty (change of) liver, not elsewhere classified: Secondary | ICD-10-CM | POA: Diagnosis not present

## 2023-02-17 ENCOUNTER — Encounter: Payer: Self-pay | Admitting: Adult Health

## 2023-02-17 ENCOUNTER — Ambulatory Visit (INDEPENDENT_AMBULATORY_CARE_PROVIDER_SITE_OTHER): Payer: 59 | Admitting: Adult Health

## 2023-02-17 ENCOUNTER — Other Ambulatory Visit (HOSPITAL_COMMUNITY)
Admission: RE | Admit: 2023-02-17 | Discharge: 2023-02-17 | Disposition: A | Discharge status: Home / Self Care | Payer: 59 | Source: Ambulatory Visit | Attending: Adult Health | Admitting: Adult Health

## 2023-02-17 VITALS — BP 116/72 | HR 63 | Ht 69.0 in | Wt 197.0 lb

## 2023-02-17 DIAGNOSIS — N898 Other specified noninflammatory disorders of vagina: Secondary | ICD-10-CM

## 2023-02-17 DIAGNOSIS — Z01419 Encounter for gynecological examination (general) (routine) without abnormal findings: Secondary | ICD-10-CM | POA: Insufficient documentation

## 2023-02-17 DIAGNOSIS — D239 Other benign neoplasm of skin, unspecified: Secondary | ICD-10-CM | POA: Diagnosis not present

## 2023-02-17 DIAGNOSIS — N941 Unspecified dyspareunia: Secondary | ICD-10-CM | POA: Diagnosis not present

## 2023-02-17 DIAGNOSIS — N644 Mastodynia: Secondary | ICD-10-CM | POA: Diagnosis not present

## 2023-02-17 DIAGNOSIS — R350 Frequency of micturition: Secondary | ICD-10-CM

## 2023-02-17 DIAGNOSIS — Z1211 Encounter for screening for malignant neoplasm of colon: Secondary | ICD-10-CM | POA: Diagnosis not present

## 2023-02-17 LAB — POCT URINALYSIS DIPSTICK
Blood, UA: NEGATIVE
Glucose, UA: POSITIVE — AB
Ketones, UA: NEGATIVE
Leukocytes, UA: NEGATIVE
Nitrite, UA: NEGATIVE
Protein, UA: NEGATIVE

## 2023-02-17 LAB — HEMOCCULT GUIAC POC 1CARD (OFFICE): Fecal Occult Blood, POC: NEGATIVE

## 2023-02-17 MED ORDER — PREMARIN 0.625 MG/GM VA CREA: TOPICAL_CREAM | VAGINAL | 2 refills | Status: DC

## 2023-02-17 NOTE — Progress Notes (Signed)
Patient ID: Julia Wang, female   DOB: May 20, 1961, 62 y.o.   MRN: [id number] History of Present Illness:  Julia Wang is a 62 year old white female,married, in for a well woman gyn exam and pap. She is complaining of urinary frequency and left breast soreness and vaginal dryness, causing pain with sex. Burns when he ejaculates inside.  PCP is Dr Margo Aye.  Current Medications, Allergies, Past Medical History, Past Surgical History, Family History and Social History were reviewed in [employer] record.     Review of Systems: Patient denies any headaches, hearing loss, fatigue, blurred vision, shortness of breath, chest pain, abdominal pain, problems with bowel movements(bowels better since weaning off metformin). No joint pain or mood swings.  See HPI for positives   Physical Exam:BP 116/72 (BP Location: Left Arm, Patient Position: Sitting, Cuff Size: Normal)   Pulse 63   Ht 5\' 9"  (1.753 m)   Wt 197 lb (89.4 kg)   LMP 12/24/2017   BMI 29.09 kg/m  urine 3+glucose  General:  Well developed, well nourished, no acute distress Skin:  Warm and dry Neck:  Midline trachea, normal thyroid, good ROM, no lymphadenopathy,no carotid bruits heard  Lungs; Clear to auscultation bilaterally Breast:  No dominant palpable mass, retraction, or nipple discharge,has tenderness outer quadrant of left breast, has had cysts in the past  Cardiovascular: Regular rate and rhythm Abdomen:  Soft, non tender, no hepatosplenomegaly Pelvic:  External genitalia is normal in appearance,has multiple angiokeratomas.  The vagina is pale and dry.Marland Kitchen Urethra has no lesions or masses. The cervix is smooth, and stenotic , pap with HR HPV genotyping performed.  Uterus is felt to be normal size, shape, and contour.  No adnexal masses or tenderness noted.Bladder is non tender, no masses felt. Rectal: Good sphincter tone, no polyps, or hemorrhoids felt.  Hemoccult negative. Extremities/musculoskeletal:  No swelling or  varicosities noted, no clubbing or cyanosis Psych:  No mood changes, alert and cooperative,seems happy AA is 0 Fall risk is low    02/17/2023    8:50 AM 09/10/2020   10:57 AM 08/01/2019    9:18 AM  Depression screen PHQ 2/9  Decreased Interest 2 1 0  Down, Depressed, Hopeless 0 0 0  PHQ - 2 Score 2 1 0  Altered sleeping 2 1   Tired, decreased energy 2 1   Change in appetite 0 0   Feeling bad or failure about yourself  0 0   Trouble concentrating 0 1   Moving slowly or fidgety/restless 0 0   Suicidal thoughts 0 0   PHQ-9 Score 6 4        02/17/2023    8:50 AM 09/10/2020   10:57 AM  GAD 7 : Generalized Anxiety Score  Nervous, Anxious, on Edge 0 1  Control/stop worrying 0 1  Worry too much - different things 1 1  Trouble relaxing 0 0  Restless 0 0  Easily annoyed or irritable 0 1  Afraid - awful might happen 0 0  Total GAD 7 Score 1 4      Upstream - 02/17/23 0849       Pregnancy Intention Screening   Does the patient want to become pregnant in the next year? N/A    Does the patient's partner want to become pregnant in the next year? N/A    Would the patient like to discuss contraceptive options today? N/A      Contraception Wrap Up   Current Method Vasectomy  End Method Vasectomy    Contraception Counseling Provided No            Examination chaperoned by Malachy Mood LPN   Impression and Plan: 1. Urinary frequency 3+ glucose in urine, could be secondary to Jardiance  - POCT Urinalysis Dipstick  2. Encounter for gynecological examination with Papanicolaou smear of cervix Pap sent Pap in 3 years if normal Physical in 1 year Labs with PCP Mammogram was normal 07/09/22 Colonoscopy per GI  - Cytology - PAP( [facility])  3. Vaginal dryness Discussed trying vaginal estrogen Will rx PVC Meds ordered this encounter  Medications   conjugated estrogens (PREMARIN) vaginal cream    Sig: Use 0.5 gm in vagina daily at bedtime for 2 weeks then 2-3 x weekly     Dispense:  42.5 g    Refill:  2    Order Specific Question:   Supervising Provider    Answer:   EURE, LUTHER H [[id number]]     4. Encounter for screening fecal occult blood testing Hemoccult was negative  - POCT occult blood stool  5. Soreness breast Watch for now if changes let me know  6. Angiokeratoma She says they have bleed before   7. Dyspareunia in female Use PVC daily for 2 weeks, no sex, then try with lubricate  Let me know how it goes

## 2023-02-18 LAB — CYTOLOGY - PAP
Comment: NEGATIVE
Diagnosis: NEGATIVE
High risk HPV: NEGATIVE

## 2023-02-22 ENCOUNTER — Other Ambulatory Visit: Payer: Self-pay | Admitting: Adult Health

## 2023-02-22 MED ORDER — ESTRADIOL 0.1 MG/GM VA CREA: TOPICAL_CREAM | VAGINAL | 1 refills | Status: AC

## 2023-02-22 NOTE — Progress Notes (Signed)
Rx estrace vaginal cream

## 2023-04-16 DIAGNOSIS — E782 Mixed hyperlipidemia: Secondary | ICD-10-CM | POA: Diagnosis not present

## 2023-04-16 DIAGNOSIS — E1165 Type 2 diabetes mellitus with hyperglycemia: Secondary | ICD-10-CM | POA: Diagnosis not present

## 2023-04-23 DIAGNOSIS — B3731 Acute candidiasis of vulva and vagina: Secondary | ICD-10-CM | POA: Diagnosis not present

## 2023-04-23 DIAGNOSIS — E538 Deficiency of other specified B group vitamins: Secondary | ICD-10-CM | POA: Diagnosis not present

## 2023-04-23 DIAGNOSIS — E559 Vitamin D deficiency, unspecified: Secondary | ICD-10-CM | POA: Diagnosis not present

## 2023-04-23 DIAGNOSIS — E782 Mixed hyperlipidemia: Secondary | ICD-10-CM | POA: Diagnosis not present

## 2023-04-23 DIAGNOSIS — Z79890 Hormone replacement therapy (postmenopausal): Secondary | ICD-10-CM | POA: Diagnosis not present

## 2023-04-23 DIAGNOSIS — K760 Fatty (change of) liver, not elsewhere classified: Secondary | ICD-10-CM | POA: Diagnosis not present

## 2023-04-23 DIAGNOSIS — E1165 Type 2 diabetes mellitus with hyperglycemia: Secondary | ICD-10-CM | POA: Diagnosis not present

## 2023-04-23 DIAGNOSIS — J302 Other seasonal allergic rhinitis: Secondary | ICD-10-CM | POA: Diagnosis not present

## 2023-04-23 DIAGNOSIS — M79672 Pain in left foot: Secondary | ICD-10-CM | POA: Diagnosis not present

## 2023-04-23 DIAGNOSIS — F411 Generalized anxiety disorder: Secondary | ICD-10-CM | POA: Diagnosis not present

## 2023-04-23 DIAGNOSIS — I471 Supraventricular tachycardia, unspecified: Secondary | ICD-10-CM | POA: Diagnosis not present

## 2023-04-23 DIAGNOSIS — Z23 Encounter for immunization: Secondary | ICD-10-CM | POA: Diagnosis not present

## 2023-05-31 DIAGNOSIS — E1165 Type 2 diabetes mellitus with hyperglycemia: Secondary | ICD-10-CM | POA: Diagnosis not present

## 2023-05-31 DIAGNOSIS — J302 Other seasonal allergic rhinitis: Secondary | ICD-10-CM | POA: Diagnosis not present

## 2023-05-31 DIAGNOSIS — Z79899 Other long term (current) drug therapy: Secondary | ICD-10-CM | POA: Diagnosis not present

## 2023-05-31 DIAGNOSIS — Z7984 Long term (current) use of oral hypoglycemic drugs: Secondary | ICD-10-CM | POA: Diagnosis not present

## 2023-05-31 DIAGNOSIS — J019 Acute sinusitis, unspecified: Secondary | ICD-10-CM | POA: Diagnosis not present

## 2023-06-14 ENCOUNTER — Ambulatory Visit
Admission: EM | Admit: 2023-06-14 | Discharge: 2023-06-14 | Disposition: A | Discharge status: Home / Self Care | Payer: 59

## 2023-06-14 DIAGNOSIS — R109 Unspecified abdominal pain: Secondary | ICD-10-CM

## 2023-06-14 NOTE — Discharge Instructions (Signed)
Go to the emergency department for further evaluation

## 2023-06-14 NOTE — ED Triage Notes (Addendum)
Pt reports waking up this morning with Right side abdominal pain with diarrhea, nausea and vomiting. Pain has been constant. Denies any dietary changes.   Pt sates she recently finished Cefdinir rx's that she was on for 7 days.

## 2023-06-14 NOTE — ED Notes (Signed)
Patient is being discharged from the Urgent Care and sent to the Emergency Department via [facility] . Per provider Jackson Latino., patient is in need of higher level of care due to abdominal pain needing further testing. Patient is aware and verbalizes understanding of plan of care.  Vitals:   06/14/23 1644  BP: 107/71  Pulse: 100  Resp: 16  Temp: 99.5 F (37.5 C)  SpO2: 94%

## 2023-06-14 NOTE — ED Provider Notes (Signed)
[facility]    CSN: [id number] Arrival date & time: 06/14/23  1612      History   Chief Complaint No chief complaint on file.   HPI Julia Wang is a 62 y.o. female.   The history is provided by the patient.   Patient presents for complaints of right side abdominal pain with diarrhea, nausea, and vomiting that started this morning.  Patient states that she has also had a low-grade fever.  She states that the pain has been constant, rates the pain 5/10 at present.  She reports that she recently completed a course of cefdinir for an ear infection and sinus infection.  She denies chest pain, constipation, gas, bloating, bloody stools, urinary symptoms, or low back pain.  Patient states that the pain wraps around her upper abdomen under her breast, and states that she mainly has pain in the right upper quadrant of her abdomen.  Patient denies prior history of kidney stones, per her chart, she was recently treated for pyelonephritis.  States that she was able to eat sherbet and keep that down. Past Medical History:  Diagnosis Date   Complex cyst of left ovary    Diabetes mellitus without complication (HCC)    Melanoma (HCC)    Pre-diabetes    SVT (supraventricular tachycardia) (HCC)     Patient Active Problem List   Diagnosis Date Noted   Angiokeratoma 02/17/2023   Soreness breast 02/17/2023   Encounter for gynecological examination with Papanicolaou smear of cervix 02/17/2023   Urinary frequency 02/17/2023   Encounter for screening fecal occult blood testing 09/10/2020   Cyst of left ovary 08/22/2019   Body aches 08/22/2019   Vaginal dryness 08/01/2019   Dyspareunia in female 08/01/2019   Tenderness of female pelvic organs 08/01/2019   Screening for colorectal cancer 08/01/2019   Encounter for well woman exam with routine gynecological exam 08/01/2019   Boil 08/01/2019   Type 2 diabetes mellitus (HCC) 05/02/2013   Urethral diverticulum 05/02/2013    Past  Surgical History:  Procedure Laterality Date   CESAREAN SECTION  1986   CHOLECYSTECTOMY     COLONOSCOPY N/A 05/02/2015   Procedure: COLONOSCOPY;  Surgeon: Malissa Hippo, MD;  Location: [facility];  Service: Endoscopy;  Laterality: N/A;  1030    OB History     Gravida  2   Para  1   Term  1   Preterm      AB  1   Living  1      SAB  1   IAB      Ectopic      Multiple      Live Births  1            Home Medications    Prior to Admission medications   Medication Sig Start Date End Date Taking? Authorizing Provider  ALPRAZolam Prudy Feeler) 1 MG tablet TK 1 T PO TID PRF ANXIETY 03/15/19   [provider]  aspirin EC 81 MG tablet Take 81 mg by mouth daily.    [provider]  CRANBERRY PO Take by mouth every morning.    [provider]  diclofenac (VOLTAREN) 75 MG EC tablet TAKE 1 TABLET BY MOUTH TWICE DAILY FOR 2 WEEKS THEN AS NEEDED FOR INFLAMMATION 06/27/19   [provider]  estradiol (ESTRACE) 0.1 MG/GM vaginal cream Use 0.5 gm in vagina nightly for 2 weeks then 2-3 x weekly 02/22/23   Adline Potter, NP  fluticasone (FLONASE) 50 MCG/ACT nasal spray SPRAY ONCE INTO EACH NOSTRIL ONCE DAILY 08/07/19   [provider]  JARDIANCE 10 MG TABS tablet Take 10 mg by mouth daily. 02/14/23   [provider]  metFORMIN (GLUCOPHAGE) 500 MG tablet Take 500 mg by mouth daily with breakfast. 08/04/17   [provider]  metoprolol tartrate (LOPRESSOR) 50 MG tablet Take 50 mg by mouth daily as needed.    [provider]  Omega-3 Fatty Acids (FISH OIL PO) Take by mouth.    [provider]  pravastatin (PRAVACHOL) 20 MG tablet Take by mouth. 07/30/20   [provider]  sertraline (ZOLOFT) 50 MG tablet Take 75 mg by mouth daily. 75 mg every other day; 50 mg every other day    [provider]  SODIUM FLUORIDE 5000 PPM 1.1 % PSTE Take by mouth as directed. 01/26/23   [provider]   tretinoin (RETIN-A) 0.025 % cream Apply topically at bedtime. 09/17/20   [provider]  VITAMIN D PO Take by mouth.    [provider]    Family History Family History  Problem Relation Age of Onset   Cancer Mother    Heart failure Father    Breast cancer Sister    Cancer Brother        lung   Alcoholism Brother     Social History Social History   Tobacco Use   Smoking status: Never   Smokeless tobacco: Never  Vaping Use   Vaping status: Never Used  Substance Use Topics   Alcohol use: No   Drug use: No     Allergies   Daypro [oxaprozin]   Review of Systems Review of Systems Per HPI  Physical Exam Triage Vital Signs ED Triage Vitals  Encounter Vitals Group     BP 06/14/23 1644 107/71     Systolic BP Percentile --      Diastolic BP Percentile --      Pulse Rate 06/14/23 1644 100     Resp 06/14/23 1644 16     Temp 06/14/23 1644 99.5 F (37.5 C)     Temp Source 06/14/23 1644 Oral     SpO2 06/14/23 1644 94 %     Weight --      Height --      Head Circumference --      Peak Flow --      Pain Score 06/14/23 1647 4     Pain Loc --      Pain Education --      Exclude from Growth Chart --    No data found.  Updated Vital Signs BP 107/71 (BP Location: Right Arm)   Pulse 100   Temp 99.5 F (37.5 C) (Oral)   Resp 16   LMP 12/24/2017   SpO2 94%   Visual Acuity Right Eye Distance:   Left Eye Distance:   Bilateral Distance:    Right Eye Near:   Left Eye Near:    Bilateral Near:     Physical Exam Vitals and nursing note reviewed.  Constitutional:      General: She is not in acute distress.    Appearance: Normal appearance.  HENT:     Head: Normocephalic.  Eyes:     Extraocular Movements: Extraocular movements intact.     Pupils: Pupils are equal, round, and reactive to light.  Cardiovascular:     Rate and Rhythm: Normal rate and regular rhythm.     Pulses: Normal  pulses.     Heart sounds: Normal heart sounds.  Pulmonary:      Effort: Pulmonary effort is normal. No respiratory distress.     Breath sounds: Normal breath sounds. No stridor. No wheezing, rhonchi or rales.  Abdominal:     General: Bowel sounds are normal.     Palpations: Abdomen is soft.     Tenderness: There is abdominal tenderness in the right upper quadrant, right lower quadrant and epigastric area. There is no right CVA tenderness, left CVA tenderness, guarding or rebound. Negative signs include Murphy's sign.  Musculoskeletal:     Cervical back: Normal range of motion.  Lymphadenopathy:     Cervical: No cervical adenopathy.  Skin:    General: Skin is warm and dry.  Neurological:     General: No focal deficit present.     Mental Status: She is alert and oriented to person, place, and time.  Psychiatric:        Mood and Affect: Mood normal.        Behavior: Behavior normal.      UC Treatments / Results  Labs (all labs ordered are listed, but only abnormal results are displayed) Labs Reviewed  POCT URINALYSIS DIP (MANUAL ENTRY)    EKG   Radiology No results found.  Procedures Procedures (including critical care time)  Medications Ordered in UC Medications - No data to display  Initial Impression / Assessment and Plan / UC Course  I have reviewed the triage vital signs and the nursing notes.  Pertinent labs & imaging results that were available during my care of the patient were reviewed by me and considered in my medical decision making (see chart for details).  On exam, patient with abdominal tenderness in the epigastric region, right upper quadrant, and right lower quadrant.  Cannot rule out pancreatitis versus appendicitis at this time.  Given the patient's current symptoms, discussion with patient regarding concerns for possible differential diagnoses.  Advised patient that it is recommended that she go to the emergency department for further evaluation.  Patient verbalized understanding.  States that she may wait until  symptoms worsen.  Advised patient of my current recommendation for further treatment.  Patient was in agreement with this recommendation, verbalized understanding.  All questions were answered.  Patient's vital signs are stable, she is able to travel via private vehicle.  Patient discharged to the emergency department.   Final Clinical Impressions(s) / UC Diagnoses   Final diagnoses:  None   Discharge Instructions   None    ED Prescriptions   None    PDMP not reviewed this encounter.   Abran Cantor, NP 06/14/23 [phone]

## 2023-07-15 ENCOUNTER — Other Ambulatory Visit (HOSPITAL_COMMUNITY): Payer: Self-pay | Admitting: Nurse Practitioner

## 2023-07-15 DIAGNOSIS — R059 Cough, unspecified: Secondary | ICD-10-CM

## 2023-07-16 ENCOUNTER — Ambulatory Visit (HOSPITAL_COMMUNITY)
Admission: RE | Admit: 2023-07-16 | Discharge: 2023-07-16 | Disposition: A | Discharge status: Home / Self Care | Payer: No Typology Code available for payment source | Source: Ambulatory Visit | Attending: Nurse Practitioner | Admitting: Nurse Practitioner

## 2023-07-16 ENCOUNTER — Other Ambulatory Visit (HOSPITAL_COMMUNITY): Payer: Self-pay | Admitting: Obstetrics & Gynecology

## 2023-07-16 DIAGNOSIS — R059 Cough, unspecified: Secondary | ICD-10-CM | POA: Diagnosis present

## 2023-07-16 DIAGNOSIS — Z1231 Encounter for screening mammogram for malignant neoplasm of breast: Secondary | ICD-10-CM

## 2023-08-09 ENCOUNTER — Ambulatory Visit (HOSPITAL_COMMUNITY)
Admission: RE | Admit: 2023-08-09 | Discharge: 2023-08-09 | Disposition: A | Discharge status: Home / Self Care | Payer: No Typology Code available for payment source | Source: Ambulatory Visit | Attending: Obstetrics & Gynecology | Admitting: Obstetrics & Gynecology

## 2023-08-09 DIAGNOSIS — Z1231 Encounter for screening mammogram for malignant neoplasm of breast: Secondary | ICD-10-CM | POA: Diagnosis present
